# Patient Record
Sex: Female | Born: 1985 | Race: Asian | Hispanic: No | Marital: Married | Smoking: Never smoker
Health system: Southern US, Community
[De-identification: ages and names within clinical notes are randomized; demographics above are authoritative.]

## PROBLEM LIST (undated history)

## (undated) DIAGNOSIS — Z789 Other specified health status: Secondary | ICD-10-CM

## (undated) HISTORY — DX: Other specified health status: Z78.9

## (undated) HISTORY — PX: NO PAST SURGERIES: SHX2092

---

## 2011-01-21 ENCOUNTER — Other Ambulatory Visit: Payer: Self-pay

## 2011-01-21 DIAGNOSIS — Z331 Pregnant state, incidental: Secondary | ICD-10-CM

## 2011-01-21 NOTE — Progress Notes (Signed)
Prenatal labs done today [name] [name] 

## 2011-01-22 LAB — OBSTETRIC PANEL
Basophils Absolute: 0 10*3/uL (ref 0.0–0.1)
Basophils Relative: 0 % (ref 0–1)
Lymphocytes Relative: 26 % (ref 12–46)
MCHC: 33.5 g/dL (ref 30.0–36.0)
Neutro Abs: 7 10*3/uL (ref 1.7–7.7)
Platelets: 280 10*3/uL (ref 150–400)
RDW: 14.2 % (ref 11.5–15.5)
Rubella: 0.8 IU/mL
WBC: 10.9 10*3/uL — ABNORMAL HIGH (ref 4.0–10.5)

## 2011-01-22 LAB — HIV ANTIBODY (ROUTINE TESTING W REFLEX): HIV: NONREACTIVE

## 2011-01-23 LAB — CULTURE, OB URINE
Colony Count: NO GROWTH
Organism ID, Bacteria: NO GROWTH

## 2011-01-28 ENCOUNTER — Other Ambulatory Visit (HOSPITAL_COMMUNITY)
Admission: RE | Admit: 2011-01-28 | Discharge: 2011-01-28 | Disposition: A | Discharge status: Home / Self Care | Payer: Self-pay | Source: Ambulatory Visit | Attending: Emergency Medicine | Admitting: Emergency Medicine

## 2011-01-28 ENCOUNTER — Ambulatory Visit (INDEPENDENT_AMBULATORY_CARE_PROVIDER_SITE_OTHER): Payer: Self-pay | Admitting: Emergency Medicine

## 2011-01-28 ENCOUNTER — Encounter: Payer: Self-pay | Admitting: Emergency Medicine

## 2011-01-28 VITALS — BP 102/58 | Temp 98.7°F | Wt 109.6 lb

## 2011-01-28 DIAGNOSIS — Z01419 Encounter for gynecological examination (general) (routine) without abnormal findings: Secondary | ICD-10-CM | POA: Insufficient documentation

## 2011-01-28 DIAGNOSIS — Z23 Encounter for immunization: Secondary | ICD-10-CM

## 2011-01-28 DIAGNOSIS — Z3400 Encounter for supervision of normal first pregnancy, unspecified trimester: Secondary | ICD-10-CM

## 2011-01-28 DIAGNOSIS — Z111 Encounter for screening for respiratory tuberculosis: Secondary | ICD-10-CM

## 2011-01-28 MED ORDER — PRENATE ELITE 90-600-400 MG-MCG-MCG PO TABS: 1.0000 | ORAL_TABLET | Freq: Every day | ORAL | Status: AC

## 2011-01-28 NOTE — Patient Instructions (Signed)
Please follow up with me in 4-6 weeks for routine prenatal care.  You will have a PPD (or TB) test placed today.  You MUST return to the clinic on Thursday to have it read - this will be done with a nursing appointment.  Pregnancy If you are planning on getting pregnant, it is a good idea to make a preconception appointment with your care- giver to discuss having a healthy lifestyle before getting pregnant. Such as, diet, weight, exercise, taking prenatal vitamins especially folic acid (it helps prevent brain and spinal cord defects), avoiding alcohol, smoking and illegal drugs, medical problems (diabetes, convulsions), family history of genetic problems, working conditions and immunizations. It is better to have knowledge of these things and do something about them before getting pregnant. In your pregnancy, it is important to follow certain guidelines to have a healthy baby. It is very important to get good prenatal care and follow your caregiver's instructions. Prenatal care includes all the medical care you receive before your baby's birth. This helps to prevent problems during the pregnancy and childbirth. HOME CARE INSTRUCTIONS   Start your prenatal visits by the 12th week of pregnancy or before when possible. They are usually scheduled monthly at first. They are more often in the last 2 months before delivery. It is important that you keep your caregiver's appointments and follow your caregiver's instructions regarding medication use, exercise, and diet.   During pregnancy, you are providing food for you and your baby. Eat a regular, well-balanced diet. Choose foods such as meat, fish, milk and other dairy products, vegetables, fruits, whole-grain breads and cereals. Your caregiver will inform you of the ideal weight gain depending on your current height and weight. Drink lots of liquids. Try to drink 8 glasses of water a day.   Alcohol is associated with a number of birth defects including fetal  alcohol syndrome. It is best to avoid alcohol completely. Smoking will cause low birth rate and prematurity. Use of alcohol and nicotine during your pregnancy also increases the chances that your child will be chemically dependent later in their life and may contribute to SIDS (Sudden Infant Death Syndrome).   Do not use illegal drugs.   Only take prescription or over-the-counter medications that are recommended by your caregiver. Other medications can cause genetic and physical problems in the baby.   Morning sickness can often be helped by keeping soda crackers at the bedside. Eat a couple before arising in the morning.   A sexual relationship may be continued until near the end of pregnancy if there are no other problems such as early (premature) leaking of amniotic fluid from the membranes, vaginal bleeding, painful intercourse or belly (abdominal) pain.   Exercise regularly. Check with your caregiver if you are unsure of the safety of some of your exercises.   Do not use hot tubs, steam rooms or saunas. These increase the risk of fainting or passing out and hurting yourself and the baby. Swimming is OK for exercise. Get plenty of rest, including afternoon naps when possible especially in the third trimester.   Avoid toxic odors and chemicals.   Do not wear high heels. They may cause you to lose your balance and fall.   Do not lift over 5 pounds. If you do lift anything, lift with your legs and thighs, not your back.   Avoid long trips, especially in the third trimester.   If you have to travel out of the city or state, take a copy  of your medical records with you.  SEEK IMMEDIATE MEDICAL CARE IF:   You develop an unexplained oral temperature above 102 F (38.9 C), or as your caregiver suggests.   You have leaking of fluid from the vagina. If leaking membranes are suspected, take your temperature and inform your caregiver of this when you call.   There is vaginal spotting or  bleeding. Notify your caregiver of the amount and how many pads are used.   You continue to feel sick to your stomach (nauseous) and have no relief from remedies suggested, or you throw up (vomit) blood or coffee ground like materials.   You develop upper abdominal pain.   You have round ligament discomfort in the lower abdominal area. This still must be evaluated by your caregiver.   You feel contractions of the uterus.   You do not feel the baby move, or there is less movement than before.   You have painful urination.   You have abnormal vaginal discharge.   You have persistent diarrhea.   You get a severe headache.   You have problems with your vision.   You develop muscle weakness.   You feel dizzy and faint.   You develop shortness of breath.   You develop chest pain.   You have back pain that travels down to your leg and feet.   You feel irregular or a very fast heartbeat.   You develop excessive weight gain in a short period of time (5 pounds in 3 to 5 days).   You are involved with a domestic violence situation.  Document Released: 02/17/2005 Document Revised: 10/30/2010 Document Reviewed: 08/11/2008 [facility] Patient Information 2012 [place], [place].

## 2011-01-28 NOTE — Progress Notes (Signed)
S: No complaints of nausea or vomiting.  Does have occasional lower abdominal cramping that lasts 1-2 hours and resolves spontaneously.  No vaginal bleeding or discharge.    O: vitals as in flowsheet Pelvic: normal external genitalia; normal vagina and cervix; small amount of white discharge present; bimanual exam normal Ext: no peripheral edema  A/P: normal pregnancy -rubella non-immune -no history or varicella infx or vaccination -discussed avoiding exposure to people with rashes -planning on WIC, have orange card and are involved in another program for pregnancy assistance -Moved from [place] - no reported exposure to TB --> will place PPD; to return on Thursday for read -would like genetic screening - will do quad screen at 15-18 weeks. -flu shot today -prescription for prenatal vitamin given -discussed appropriate weight gain and diet -discussed taking only tylenol for pain during pregnancy

## 2011-01-29 LAB — GC/CHLAMYDIA PROBE AMP, GENITAL
Chlamydia, DNA Probe: NEGATIVE
GC Probe Amp, Genital: NEGATIVE

## 2011-01-30 ENCOUNTER — Ambulatory Visit (INDEPENDENT_AMBULATORY_CARE_PROVIDER_SITE_OTHER): Payer: Self-pay | Admitting: *Deleted

## 2011-01-30 DIAGNOSIS — R7611 Nonspecific reaction to tuberculin skin test without active tuberculosis: Secondary | ICD-10-CM

## 2011-01-30 LAB — TB SKIN TEST
Induration: 18
TB Skin Test: POSITIVE mm

## 2011-01-30 NOTE — Progress Notes (Signed)
In today  to read PPD that was placed 2 days ago. 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 Dr. Mauricio Po consulted and he came in and spoke with patient about this .  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  Denies any weight loss, fevers, cough. . Advised patient to address this after birth of baby. Will send message to Dr. Elwyn Reach also.

## 2011-02-10 NOTE — Progress Notes (Signed)
Patient seen in RN office; she has a positive ppd, administered at her New [facility] visit.  She denies fevers, chills, weight loss, cough, hemoptysis, or contact with anyone known to have active pulmonary TB.  She is counseled to follow up for positive PPD with the [facility] after delivery; no action needed during her pregnancy.  [name] [name]

## 2011-02-28 ENCOUNTER — Encounter: Payer: Self-pay | Admitting: Emergency Medicine

## 2011-03-04 NOTE — L&D Delivery Note (Signed)
Delivery Note At  a viable female was delivered via NSVD (Presentation: LOA ;  ).  APGAR: , ; weight .   Placenta status: spontaneous, intact.  Cord: 3-vessel with the following complications: none.  Cord pH: n/a  Anesthesia:  epidural Episiotomy: n/a Lacerations: none Est. Blood Loss (mL): 300  Mom to postpartum.  Baby to nursery-stable.  BOOTH, ERIN 08/17/2011, 5:03 AM   I was present for delivery and agree with note above. [facility]

## 2011-03-10 ENCOUNTER — Ambulatory Visit: Payer: Self-pay | Admitting: Emergency Medicine

## 2011-03-10 VITALS — BP 103/66 | Wt 116.0 lb

## 2011-03-10 DIAGNOSIS — Z3400 Encounter for supervision of normal first pregnancy, unspecified trimester: Secondary | ICD-10-CM

## 2011-03-10 NOTE — Progress Notes (Signed)
S: Doing well overall.  Does have some complaints of intermittent vomiting, none in the last week.  Also having some itching on her legs and belly, this is improved with lotion use; does not interfere with sleep.  Has also had some back pain, but does not require medication.  Has had several nosebleeds and her gums bleed after brushing her teeth.  The bleeding stops quickly.  Has felt the baby kick.  Denies vaginal bleeding or discharge.  O: vitals reviewed Fundal Height: just below umbilicus FHT: 140  A/P: normal pregnancy -discussed tylenol for back pain if needed -okay to use benadryl if itching is keeping her up at night -ordered anatomy screen -declined genetic screening -does not meet criteria for early glucola screening -continue prenatal vitamin -follow up in 4 weeks

## 2011-03-10 NOTE — Patient Instructions (Signed)
It was nice to see you again!  It sounds like things are going very well with your pregnancy.  The back pain, itchiness, vomiting and nose/gum bleeding are normal. You can take tylenol for the back pain if needed.  For the itching, continue to use lotion.  If the itching is preventing you from sleeping, it is okay to take Benadryl.  The vomiting/upset stomach should start to improve.  Try bland foods first, and eating smaller more frequent meals may help.  The nose bleeds and bleeding gums are normal.  If they start lasting a long time and are difficult to stop, we can check some lab values.  Pregnancy - Second Trimester The second trimester of pregnancy (3 to 6 months) is a period of rapid growth for you and your baby. At the end of the sixth month, your baby is about 9 inches long and weighs 1 1/2 pounds. You will begin to feel the baby move between 18 and 20 weeks of the pregnancy. This is called quickening. Weight gain is faster. A clear fluid (colostrum) may leak out of your breasts. You may feel small contractions of the womb (uterus). This is known as false labor or Braxton-Hicks contractions. This is like a practice for labor when the baby is ready to be born. Usually, the problems with morning sickness have usually passed by the end of your first trimester. Some women develop small dark blotches (called cholasma, mask of pregnancy) on their face that usually goes away after the baby is born. Exposure to the sun makes the blotches worse. Acne may also develop in some pregnant women and pregnant women who have acne, may find that it goes away. PRENATAL EXAMS  Blood work may continue to be done during prenatal exams. These tests are done to check on your health and the probable health of your baby. Blood work is used to follow your blood levels (hemoglobin). Anemia (low hemoglobin) is common during pregnancy. Iron and vitamins are given to help prevent this. You will also be checked for diabetes  between 24 and 28 weeks of the pregnancy. Some of the previous blood tests may be repeated.   The size of the uterus is measured during each visit. This is to make sure that the baby is continuing to grow properly according to the dates of the pregnancy.   Your blood pressure is checked every prenatal visit. This is to make sure you are not getting toxemia.   Your urine is checked to make sure you do not have an infection, diabetes or protein in the urine.   Your weight is checked often to make sure gains are happening at the suggested rate. This is to ensure that both you and your baby are growing normally.   Sometimes, an ultrasound is performed to confirm the proper growth and development of the baby. This is a test which bounces harmless sound waves off the baby so your caregiver can more accurately determine due dates.  Sometimes, a specialized test is done on the amniotic fluid surrounding the baby. This test is called an amniocentesis. The amniotic fluid is obtained by sticking a needle into the belly (abdomen). This is done to check the chromosomes in instances where there is a concern about possible genetic problems with the baby. It is also sometimes done near the end of pregnancy if an early delivery is required. In this case, it is done to help make sure the baby's lungs are mature enough for the baby  to live outside of the womb. CHANGES OCCURING IN THE SECOND TRIMESTER OF PREGNANCY Your body goes through many changes during pregnancy. They vary from person to person. Talk to your caregiver about changes you notice that you are concerned about.  During the second trimester, you will likely have an increase in your appetite. It is normal to have cravings for certain foods. This varies from person to person and pregnancy to pregnancy.   Your lower abdomen will begin to bulge.   You may have to urinate more often because the uterus and baby are pressing on your bladder. It is also common  to get more bladder infections during pregnancy (pain with urination). You can help this by drinking lots of fluids and emptying your bladder before and after intercourse.   You may begin to get stretch marks on your hips, abdomen, and breasts. These are normal changes in the body during pregnancy. There are no exercises or medications to take that prevent this change.   You may begin to develop swollen and bulging veins (varicose veins) in your legs. Wearing support hose, elevating your feet for 15 minutes, 3 to 4 times a day and limiting salt in your diet helps lessen the problem.   Heartburn may develop as the uterus grows and pushes up against the stomach. Antacids recommended by your caregiver helps with this problem. Also, eating smaller meals 4 to 5 times a day helps.   Constipation can be treated with a stool softener or adding bulk to your diet. Drinking lots of fluids, vegetables, fruits, and whole grains are helpful.   Exercising is also helpful. If you have been very active up until your pregnancy, most of these activities can be continued during your pregnancy. If you have been less active, it is helpful to start an exercise program such as walking.   Hemorrhoids (varicose veins in the rectum) may develop at the end of the second trimester. Warm sitz baths and hemorrhoid cream recommended by your caregiver helps hemorrhoid problems.   Backaches may develop during this time of your pregnancy. Avoid heavy lifting, wear low heal shoes and practice good posture to help with backache problems.   Some pregnant women develop tingling and numbness of their hand and fingers because of swelling and tightening of ligaments in the wrist (carpel tunnel syndrome). This goes away after the baby is born.   As your breasts enlarge, you may have to get a bigger bra. Get a comfortable, cotton, support bra. Do not get a nursing bra until the last month of the pregnancy if you will be nursing the baby.    You may get a dark line from your belly button to the pubic area called the linea nigra.   You may develop rosy cheeks because of increase blood flow to the face.   You may develop spider looking lines of the face, neck, arms and chest. These go away after the baby is born.  HOME CARE INSTRUCTIONS   It is extremely important to avoid all smoking, herbs, alcohol, and unprescribed drugs during your pregnancy. These chemicals affect the formation and growth of the baby. Avoid these chemicals throughout the pregnancy to ensure the delivery of a healthy infant.   Most of your home care instructions are the same as suggested for the first trimester of your pregnancy. Keep your caregiver's appointments. Follow your caregiver's instructions regarding medication use, exercise and diet.   During pregnancy, you are providing food for you and your baby.  Continue to eat regular, well-balanced meals. Choose foods such as meat, fish, milk and other low fat dairy products, vegetables, fruits, and whole-grain breads and cereals. Your caregiver will tell you of the ideal weight gain.   A physical sexual relationship may be continued up until near the end of pregnancy if there are no other problems. Problems could include early (premature) leaking of amniotic fluid from the membranes, vaginal bleeding, abdominal pain, or other medical or pregnancy problems.   Exercise regularly if there are no restrictions. Check with your caregiver if you are unsure of the safety of some of your exercises. The greatest weight gain will occur in the last 2 trimesters of pregnancy. Exercise will help you:   Control your weight.   Get you in shape for labor and delivery.   Lose weight after you have the baby.   Wear a good support or jogging bra for breast tenderness during pregnancy. This may help if worn during sleep. Pads or tissues may be used in the bra if you are leaking colostrum.   Do not use hot tubs, steam rooms or  saunas throughout the pregnancy.   Wear your seat belt at all times when driving. This protects you and your baby if you are in an accident.   Avoid raw meat, uncooked cheese, cat litter boxes and soil used by cats. These carry germs that can cause birth defects in the baby.   The second trimester is also a good time to visit your dentist for your dental health if this has not been done yet. Getting your teeth cleaned is OK. Use a soft toothbrush. Brush gently during pregnancy.   It is easier to loose urine during pregnancy. Tightening up and strengthening the pelvic muscles will help with this problem. Practice stopping your urination while you are going to the bathroom. These are the same muscles you need to strengthen. It is also the muscles you would use as if you were trying to stop from passing gas. You can practice tightening these muscles up 10 times a set and repeating this about 3 times per day. Once you know what muscles to tighten up, do not perform these exercises during urination. It is more likely to contribute to an infection by backing up the urine.   Ask for help if you have financial, counseling or nutritional needs during pregnancy. Your caregiver will be able to offer counseling for these needs as well as refer you for other special needs.   Your skin may become oily. If so, wash your face with mild soap, use non-greasy moisturizer and oil or cream based makeup.  MEDICATIONS AND DRUG USE IN PREGNANCY  Take prenatal vitamins as directed. The vitamin should contain 1 milligram of folic acid. Keep all vitamins out of reach of children. Only a couple vitamins or tablets containing iron may be fatal to a baby or young child when ingested.   Avoid use of all medications, including herbs, over-the-counter medications, not prescribed or suggested by your caregiver. Only take over-the-counter or prescription medicines for pain, discomfort, or fever as directed by your caregiver. Do not  use aspirin.   Let your caregiver also know about herbs you may be using.   Alcohol is related to a number of birth defects. This includes fetal alcohol syndrome. All alcohol, in any form, should be avoided completely. Smoking will cause low birth rate and premature babies.   Street or illegal drugs are very harmful to the baby. They are  absolutely forbidden. A baby born to an addicted mother will be addicted at birth. The baby will go through the same withdrawal an adult does.  SEEK MEDICAL CARE IF:  You have any concerns or worries during your pregnancy. It is better to call with your questions if you feel they cannot wait, rather than worry about them. SEEK IMMEDIATE MEDICAL CARE IF:   An unexplained oral temperature above 102 F (38.9 C) develops, or as your caregiver suggests.   You have leaking of fluid from the vagina (birth canal). If leaking membranes are suspected, take your temperature and tell your caregiver of this when you call.   There is vaginal spotting, bleeding, or passing clots. Tell your caregiver of the amount and how many pads are used. Light spotting in pregnancy is common, especially following intercourse.   You develop a bad smelling vaginal discharge with a change in the color from clear to white.   You continue to feel sick to your stomach (nauseated) and have no relief from remedies suggested. You vomit blood or coffee ground-like materials.   You lose more than 2 pounds of weight or gain more than 2 pounds of weight over 1 week, or as suggested by your caregiver.   You notice swelling of your face, hands, feet, or legs.   You get exposed to [place] measles and have never had them.   You are exposed to fifth disease or chickenpox.   You develop belly (abdominal) pain. Round ligament discomfort is a common non-cancerous (benign) cause of abdominal pain in pregnancy. Your caregiver still must evaluate you.   You develop a bad headache that does not go away.     You develop fever, diarrhea, pain with urination, or shortness of breath.   You develop visual problems, blurry, or double vision.   You fall or are in a car accident or any kind of trauma.   There is mental or physical violence at home.  Document Released: 02/11/2001 Document Revised: 10/30/2010 Document Reviewed: 08/16/2008 [facility] Patient Information 2012 [place], [place].

## 2011-03-19 ENCOUNTER — Ambulatory Visit (HOSPITAL_COMMUNITY)
Admission: RE | Admit: 2011-03-19 | Discharge: 2011-03-19 | Disposition: A | Discharge status: Home / Self Care | Payer: Self-pay | Source: Ambulatory Visit | Attending: Family Medicine | Admitting: Family Medicine

## 2011-03-19 DIAGNOSIS — O358XX0 Maternal care for other (suspected) fetal abnormality and damage, not applicable or unspecified: Secondary | ICD-10-CM | POA: Insufficient documentation

## 2011-03-19 DIAGNOSIS — Z1389 Encounter for screening for other disorder: Secondary | ICD-10-CM | POA: Insufficient documentation

## 2011-03-19 DIAGNOSIS — Z3400 Encounter for supervision of normal first pregnancy, unspecified trimester: Secondary | ICD-10-CM

## 2011-03-19 DIAGNOSIS — Z363 Encounter for antenatal screening for malformations: Secondary | ICD-10-CM | POA: Insufficient documentation

## 2011-04-03 ENCOUNTER — Ambulatory Visit: Payer: Self-pay | Admitting: Emergency Medicine

## 2011-04-03 MED ORDER — DEXTROMETHORPHAN HBR 15 MG/5ML PO SYRP: 10.0000 mL | ORAL_SOLUTION | Freq: Four times a day (QID) | ORAL | Status: AC | PRN

## 2011-04-03 NOTE — Patient Instructions (Addendum)
Everything is going very well!  I'm sorry you have a cough.  You can try cough drops or hard candy to help with the cough.  Another option is Vicks Vapor rub.  I will also provide a prescription for a cough medicine called dextromethorpan.  This a syrup that you can take up to 4 times a day.  I would recommend trying the Vicks Vapor rub and cough drops first.  If you get a fever with the cough, please take tylenol to bring your temperature down.  If you are interested in a internet resource about pregnancy, the website, [email or web address], is a good one.  I will see you back in 4 weeks!  Pregnancy - Second Trimester The second trimester of pregnancy (3 to 6 months) is a period of rapid growth for you and your baby. At the end of the sixth month, your baby is about 9 inches long and weighs 1 1/2 pounds. You will begin to feel the baby move between 18 and 20 weeks of the pregnancy. This is called quickening. Weight gain is faster. A clear fluid (colostrum) may leak out of your breasts. You may feel small contractions of the womb (uterus). This is known as false labor or Braxton-Hicks contractions. This is like a practice for labor when the baby is ready to be born. Usually, the problems with morning sickness have usually passed by the end of your first trimester. Some women develop small dark blotches (called cholasma, mask of pregnancy) on their face that usually goes away after the baby is born. Exposure to the sun makes the blotches worse. Acne may also develop in some pregnant women and pregnant women who have acne, may find that it goes away. PRENATAL EXAMS  Blood work may continue to be done during prenatal exams. These tests are done to check on your health and the probable health of your baby. Blood work is used to follow your blood levels (hemoglobin). Anemia (low hemoglobin) is common during pregnancy. Iron and vitamins are given to help prevent this. You will also be checked for diabetes between 24  and 28 weeks of the pregnancy. Some of the previous blood tests may be repeated.   The size of the uterus is measured during each visit. This is to make sure that the baby is continuing to grow properly according to the dates of the pregnancy.   Your blood pressure is checked every prenatal visit. This is to make sure you are not getting toxemia.   Your urine is checked to make sure you do not have an infection, diabetes or protein in the urine.   Your weight is checked often to make sure gains are happening at the suggested rate. This is to ensure that both you and your baby are growing normally.   Sometimes, an ultrasound is performed to confirm the proper growth and development of the baby. This is a test which bounces harmless sound waves off the baby so your caregiver can more accurately determine due dates.  Sometimes, a specialized test is done on the amniotic fluid surrounding the baby. This test is called an amniocentesis. The amniotic fluid is obtained by sticking a needle into the belly (abdomen). This is done to check the chromosomes in instances where there is a concern about possible genetic problems with the baby. It is also sometimes done near the end of pregnancy if an early delivery is required. In this case, it is done to help make sure the baby's lungs are  mature enough for the baby to live outside of the womb. CHANGES OCCURING IN THE SECOND TRIMESTER OF PREGNANCY Your body goes through many changes during pregnancy. They vary from person to person. Talk to your caregiver about changes you notice that you are concerned about.  During the second trimester, you will likely have an increase in your appetite. It is normal to have cravings for certain foods. This varies from person to person and pregnancy to pregnancy.   Your lower abdomen will begin to bulge.   You may have to urinate more often because the uterus and baby are pressing on your bladder. It is also common to get more  bladder infections during pregnancy (pain with urination). You can help this by drinking lots of fluids and emptying your bladder before and after intercourse.   You may begin to get stretch marks on your hips, abdomen, and breasts. These are normal changes in the body during pregnancy. There are no exercises or medications to take that prevent this change.   You may begin to develop swollen and bulging veins (varicose veins) in your legs. Wearing support hose, elevating your feet for 15 minutes, 3 to 4 times a day and limiting salt in your diet helps lessen the problem.   Heartburn may develop as the uterus grows and pushes up against the stomach. Antacids recommended by your caregiver helps with this problem. Also, eating smaller meals 4 to 5 times a day helps.   Constipation can be treated with a stool softener or adding bulk to your diet. Drinking lots of fluids, vegetables, fruits, and whole grains are helpful.   Exercising is also helpful. If you have been very active up until your pregnancy, most of these activities can be continued during your pregnancy. If you have been less active, it is helpful to start an exercise program such as walking.   Hemorrhoids (varicose veins in the rectum) may develop at the end of the second trimester. Warm sitz baths and hemorrhoid cream recommended by your caregiver helps hemorrhoid problems.   Backaches may develop during this time of your pregnancy. Avoid heavy lifting, wear low heal shoes and practice good posture to help with backache problems.   Some pregnant women develop tingling and numbness of their hand and fingers because of swelling and tightening of ligaments in the wrist (carpel tunnel syndrome). This goes away after the baby is born.   As your breasts enlarge, you may have to get a bigger bra. Get a comfortable, cotton, support bra. Do not get a nursing bra until the last month of the pregnancy if you will be nursing the baby.   You may get  a dark line from your belly button to the pubic area called the linea nigra.   You may develop rosy cheeks because of increase blood flow to the face.   You may develop spider looking lines of the face, neck, arms and chest. These go away after the baby is born.  HOME CARE INSTRUCTIONS   It is extremely important to avoid all smoking, herbs, alcohol, and unprescribed drugs during your pregnancy. These chemicals affect the formation and growth of the baby. Avoid these chemicals throughout the pregnancy to ensure the delivery of a healthy infant.   Most of your home care instructions are the same as suggested for the first trimester of your pregnancy. Keep your caregiver's appointments. Follow your caregiver's instructions regarding medication use, exercise and diet.   During pregnancy, you are providing food  for you and your baby. Continue to eat regular, well-balanced meals. Choose foods such as meat, fish, milk and other low fat dairy products, vegetables, fruits, and whole-grain breads and cereals. Your caregiver will tell you of the ideal weight gain.   A physical sexual relationship may be continued up until near the end of pregnancy if there are no other problems. Problems could include early (premature) leaking of amniotic fluid from the membranes, vaginal bleeding, abdominal pain, or other medical or pregnancy problems.   Exercise regularly if there are no restrictions. Check with your caregiver if you are unsure of the safety of some of your exercises. The greatest weight gain will occur in the last 2 trimesters of pregnancy. Exercise will help you:   Control your weight.   Get you in shape for labor and delivery.   Lose weight after you have the baby.   Wear a good support or jogging bra for breast tenderness during pregnancy. This may help if worn during sleep. Pads or tissues may be used in the bra if you are leaking colostrum.   Do not use hot tubs, steam rooms or saunas  throughout the pregnancy.   Wear your seat belt at all times when driving. This protects you and your baby if you are in an accident.   Avoid raw meat, uncooked cheese, cat litter boxes and soil used by cats. These carry germs that can cause birth defects in the baby.   The second trimester is also a good time to visit your dentist for your dental health if this has not been done yet. Getting your teeth cleaned is OK. Use a soft toothbrush. Brush gently during pregnancy.   It is easier to loose urine during pregnancy. Tightening up and strengthening the pelvic muscles will help with this problem. Practice stopping your urination while you are going to the bathroom. These are the same muscles you need to strengthen. It is also the muscles you would use as if you were trying to stop from passing gas. You can practice tightening these muscles up 10 times a set and repeating this about 3 times per day. Once you know what muscles to tighten up, do not perform these exercises during urination. It is more likely to contribute to an infection by backing up the urine.   Ask for help if you have financial, counseling or nutritional needs during pregnancy. Your caregiver will be able to offer counseling for these needs as well as refer you for other special needs.   Your skin may become oily. If so, wash your face with mild soap, use non-greasy moisturizer and oil or cream based makeup.  MEDICATIONS AND DRUG USE IN PREGNANCY  Take prenatal vitamins as directed. The vitamin should contain 1 milligram of folic acid. Keep all vitamins out of reach of children. Only a couple vitamins or tablets containing iron may be fatal to a baby or young child when ingested.   Avoid use of all medications, including herbs, over-the-counter medications, not prescribed or suggested by your caregiver. Only take over-the-counter or prescription medicines for pain, discomfort, or fever as directed by your caregiver. Do not use  aspirin.   Let your caregiver also know about herbs you may be using.   Alcohol is related to a number of birth defects. This includes fetal alcohol syndrome. All alcohol, in any form, should be avoided completely. Smoking will cause low birth rate and premature babies.   Street or illegal drugs are very harmful  to the baby. They are absolutely forbidden. A baby born to an addicted mother will be addicted at birth. The baby will go through the same withdrawal an adult does.  SEEK MEDICAL CARE IF:  You have any concerns or worries during your pregnancy. It is better to call with your questions if you feel they cannot wait, rather than worry about them. SEEK IMMEDIATE MEDICAL CARE IF:   An unexplained oral temperature above 102 F (38.9 C) develops, or as your caregiver suggests.   You have leaking of fluid from the vagina (birth canal). If leaking membranes are suspected, take your temperature and tell your caregiver of this when you call.   There is vaginal spotting, bleeding, or passing clots. Tell your caregiver of the amount and how many pads are used. Light spotting in pregnancy is common, especially following intercourse.   You develop a bad smelling vaginal discharge with a change in the color from clear to white.   You continue to feel sick to your stomach (nauseated) and have no relief from remedies suggested. You vomit blood or coffee ground-like materials.   You lose more than 2 pounds of weight or gain more than 2 pounds of weight over 1 week, or as suggested by your caregiver.   You notice swelling of your face, hands, feet, or legs.   You get exposed to [place] measles and have never had them.   You are exposed to fifth disease or chickenpox.   You develop belly (abdominal) pain. Round ligament discomfort is a common non-cancerous (benign) cause of abdominal pain in pregnancy. Your caregiver still must evaluate you.   You develop a bad headache that does not go away.    You develop fever, diarrhea, pain with urination, or shortness of breath.   You develop visual problems, blurry, or double vision.   You fall or are in a car accident or any kind of trauma.   There is mental or physical violence at home.  Document Released: 02/11/2001 Document Revised: 10/30/2010 Document Reviewed: 08/16/2008 [facility] Patient Information 2012 [place], [place].

## 2011-04-03 NOTE — Progress Notes (Signed)
S: Doing very well.  No more vomiting.  Itching is improved.  Baby is moving well; ultrasound showed they are having a boy.  Denies vaginal bleeding or discharge.  Has had runny nose and cough x4-5 days.  No fevers.  Would like something for the cough.  O: vitals reviewed Fundal Height: at umbilicus FHT: 144  A/P: 26 yo G1P0 at [redacted]w[redacted]d -anatomy scan normal -continue prenatal vitamin -encouraged vicks/cough drops for cough; gave prescription for dextromethorpan if needed -follow up in 4 weeks

## 2011-04-03 NOTE — Progress Notes (Signed)
Also discussed weight gain of about 1 lb per week.

## 2011-05-02 ENCOUNTER — Ambulatory Visit: Payer: Self-pay | Admitting: Emergency Medicine

## 2011-05-02 VITALS — BP 110/60 | Wt 128.0 lb

## 2011-05-02 DIAGNOSIS — Z3400 Encounter for supervision of normal first pregnancy, unspecified trimester: Secondary | ICD-10-CM

## 2011-05-02 NOTE — Progress Notes (Signed)
S: Continues to have a cough, but this is starting to improve.  Have been using Vic's Vapor Rub and cough drops.  Has had two episodes of brief, sharp lower abdominal pain that self-resolved.  No vaginal bleeding or discharge.  No dizziness or RUQ pain.  O: vitals reviewed Fundal height - 24cm FHT - 150 Ext - no edema  A/P: 26 yo G1P0 at [redacted]w[redacted]d -weight gain appropriate -fetal growth appropriate -continue prenatal vitamin -continue symptomatic care for cough -can use tylenol for round ligament pain -will return to clinic in 1 week for 1-hr glucola screening -return in 4 weeks for follow up in OB clinic -discussed using common sense to limit foods/activity -hand out provided on 2nd trimester -will go over preterm labor precautions at next visit

## 2011-05-02 NOTE — Patient Instructions (Signed)
I'm sorry you still have a cough.  Keep doing the Vic's Vapor Rub and cough drops.  I would expect the cough to be getting better over the next 2 weeks.  You can always try the dextromethorpan if needed, but the cough will not hurt the baby.  For the occasional belly pain, you can take Tylenol.  It is a very safe medicine during pregnancy.  The baby looks great today!  He is growing appropriately and his heart beat is excellent!  Please make a lab appointment for next week to get the diabetes screening test - it will take about an hour.  Please make a follow up appointment in [facility] in [facility] (they like to see everyone during the second and third trimesters).   Pregnancy - Second Trimester The second trimester of pregnancy (3 to 6 months) is a period of rapid growth for you and your baby. At the end of the sixth month, your baby is about 9 inches long and weighs 1 1/2 pounds. You will begin to feel the baby move between 18 and 20 weeks of the pregnancy. This is called quickening. Weight gain is faster. A clear fluid (colostrum) may leak out of your breasts. You may feel small contractions of the womb (uterus). This is known as false labor or Braxton-Hicks contractions. This is like a practice for labor when the baby is ready to be born. Usually, the problems with morning sickness have usually passed by the end of your first trimester. Some women develop small dark blotches (called cholasma, mask of pregnancy) on their face that usually goes away after the baby is born. Exposure to the sun makes the blotches worse. Acne may also develop in some pregnant women and pregnant women who have acne, may find that it goes away. PRENATAL EXAMS  Blood work may continue to be done during prenatal exams. These tests are done to check on your health and the probable health of your baby. Blood work is used to follow your blood levels (hemoglobin). Anemia (low hemoglobin) is common during pregnancy.  Iron and vitamins are given to help prevent this. You will also be checked for diabetes between 24 and 28 weeks of the pregnancy. Some of the previous blood tests may be repeated.   The size of the uterus is measured during each visit. This is to make sure that the baby is continuing to grow properly according to the dates of the pregnancy.   Your blood pressure is checked every prenatal visit. This is to make sure you are not getting toxemia.   Your urine is checked to make sure you do not have an infection, diabetes or protein in the urine.   Your weight is checked often to make sure gains are happening at the suggested rate. This is to ensure that both you and your baby are growing normally.   Sometimes, an ultrasound is performed to confirm the proper growth and development of the baby. This is a test which bounces harmless sound waves off the baby so your caregiver can more accurately determine due dates.  Sometimes, a specialized test is done on the amniotic fluid surrounding the baby. This test is called an amniocentesis. The amniotic fluid is obtained by sticking a needle into the belly (abdomen). This is done to check the chromosomes in instances where there is a concern about possible genetic problems with the baby. It is also sometimes done near the end of pregnancy if an early  delivery is required. In this case, it is done to help make sure the baby's lungs are mature enough for the baby to live outside of the womb. CHANGES OCCURING IN THE SECOND TRIMESTER OF PREGNANCY Your body goes through many changes during pregnancy. They vary from person to person. Talk to your caregiver about changes you notice that you are concerned about.  During the second trimester, you will likely have an increase in your appetite. It is normal to have cravings for certain foods. This varies from person to person and pregnancy to pregnancy.   Your lower abdomen will begin to bulge.   You may have to urinate  more often because the uterus and baby are pressing on your bladder. It is also common to get more bladder infections during pregnancy (pain with urination). You can help this by drinking lots of fluids and emptying your bladder before and after intercourse.   You may begin to get stretch marks on your hips, abdomen, and breasts. These are normal changes in the body during pregnancy. There are no exercises or medications to take that prevent this change.   You may begin to develop swollen and bulging veins (varicose veins) in your legs. Wearing support hose, elevating your feet for 15 minutes, 3 to 4 times a day and limiting salt in your diet helps lessen the problem.   Heartburn may develop as the uterus grows and pushes up against the stomach. Antacids recommended by your caregiver helps with this problem. Also, eating smaller meals 4 to 5 times a day helps.   Constipation can be treated with a stool softener or adding bulk to your diet. Drinking lots of fluids, vegetables, fruits, and whole grains are helpful.   Exercising is also helpful. If you have been very active up until your pregnancy, most of these activities can be continued during your pregnancy. If you have been less active, it is helpful to start an exercise program such as walking.   Hemorrhoids (varicose veins in the rectum) may develop at the end of the second trimester. Warm sitz baths and hemorrhoid cream recommended by your caregiver helps hemorrhoid problems.   Backaches may develop during this time of your pregnancy. Avoid heavy lifting, wear low heal shoes and practice good posture to help with backache problems.   Some pregnant women develop tingling and numbness of their hand and fingers because of swelling and tightening of ligaments in the wrist (carpel tunnel syndrome). This goes away after the baby is born.   As your breasts enlarge, you may have to get a bigger bra. Get a comfortable, cotton, support bra. Do not get a  nursing bra until the last month of the pregnancy if you will be nursing the baby.   You may get a dark line from your belly button to the pubic area called the linea nigra.   You may develop rosy cheeks because of increase blood flow to the face.   You may develop spider looking lines of the face, neck, arms and chest. These go away after the baby is born.  HOME CARE INSTRUCTIONS   It is extremely important to avoid all smoking, herbs, alcohol, and unprescribed drugs during your pregnancy. These chemicals affect the formation and growth of the baby. Avoid these chemicals throughout the pregnancy to ensure the delivery of a healthy infant.   Most of your home care instructions are the same as suggested for the first trimester of your pregnancy. Keep your caregiver's appointments. Follow  your caregiver's instructions regarding medication use, exercise and diet.   During pregnancy, you are providing food for you and your baby. Continue to eat regular, well-balanced meals. Choose foods such as meat, fish, milk and other low fat dairy products, vegetables, fruits, and whole-grain breads and cereals. Your caregiver will tell you of the ideal weight gain.   A physical sexual relationship may be continued up until near the end of pregnancy if there are no other problems. Problems could include early (premature) leaking of amniotic fluid from the membranes, vaginal bleeding, abdominal pain, or other medical or pregnancy problems.   Exercise regularly if there are no restrictions. Check with your caregiver if you are unsure of the safety of some of your exercises. The greatest weight gain will occur in the last 2 trimesters of pregnancy. Exercise will help you:   Control your weight.   Get you in shape for labor and delivery.   Lose weight after you have the baby.   Wear a good support or jogging bra for breast tenderness during pregnancy. This may help if worn during sleep. Pads or tissues may be  used in the bra if you are leaking colostrum.   Do not use hot tubs, steam rooms or saunas throughout the pregnancy.   Wear your seat belt at all times when driving. This protects you and your baby if you are in an accident.   Avoid raw meat, uncooked cheese, cat litter boxes and soil used by cats. These carry germs that can cause birth defects in the baby.   The second trimester is also a good time to visit your dentist for your dental health if this has not been done yet. Getting your teeth cleaned is OK. Use a soft toothbrush. Brush gently during pregnancy.   It is easier to loose urine during pregnancy. Tightening up and strengthening the pelvic muscles will help with this problem. Practice stopping your urination while you are going to the bathroom. These are the same muscles you need to strengthen. It is also the muscles you would use as if you were trying to stop from passing gas. You can practice tightening these muscles up 10 times a set and repeating this about 3 times per day. Once you know what muscles to tighten up, do not perform these exercises during urination. It is more likely to contribute to an infection by backing up the urine.   Ask for help if you have financial, counseling or nutritional needs during pregnancy. Your caregiver will be able to offer counseling for these needs as well as refer you for other special needs.   Your skin may become oily. If so, wash your face with mild soap, use non-greasy moisturizer and oil or cream based makeup.  MEDICATIONS AND DRUG USE IN PREGNANCY  Take prenatal vitamins as directed. The vitamin should contain 1 milligram of folic acid. Keep all vitamins out of reach of children. Only a couple vitamins or tablets containing iron may be fatal to a baby or young child when ingested.   Avoid use of all medications, including herbs, over-the-counter medications, not prescribed or suggested by your caregiver. Only take over-the-counter or  prescription medicines for pain, discomfort, or fever as directed by your caregiver. Do not use aspirin.   Let your caregiver also know about herbs you may be using.   Alcohol is related to a number of birth defects. This includes fetal alcohol syndrome. All alcohol, in any form, should be avoided completely. Smoking  will cause low birth rate and premature babies.   Street or illegal drugs are very harmful to the baby. They are absolutely forbidden. A baby born to an addicted mother will be addicted at birth. The baby will go through the same withdrawal an adult does.  SEEK MEDICAL CARE IF:  You have any concerns or worries during your pregnancy. It is better to call with your questions if you feel they cannot wait, rather than worry about them. SEEK IMMEDIATE MEDICAL CARE IF:   An unexplained oral temperature above 102 F (38.9 C) develops, or as your caregiver suggests.   You have leaking of fluid from the vagina (birth canal). If leaking membranes are suspected, take your temperature and tell your caregiver of this when you call.   There is vaginal spotting, bleeding, or passing clots. Tell your caregiver of the amount and how many pads are used. Light spotting in pregnancy is common, especially following intercourse.   You develop a bad smelling vaginal discharge with a change in the color from clear to white.   You continue to feel sick to your stomach (nauseated) and have no relief from remedies suggested. You vomit blood or coffee ground-like materials.   You lose more than 2 pounds of weight or gain more than 2 pounds of weight over 1 week, or as suggested by your caregiver.   You notice swelling of your face, hands, feet, or legs.   You get exposed to [place] measles and have never had them.   You are exposed to fifth disease or chickenpox.   You develop belly (abdominal) pain. Round ligament discomfort is a common non-cancerous (benign) cause of abdominal pain in pregnancy.  Your caregiver still must evaluate you.   You develop a bad headache that does not go away.   You develop fever, diarrhea, pain with urination, or shortness of breath.   You develop visual problems, blurry, or double vision.   You fall or are in a car accident or any kind of trauma.   There is mental or physical violence at home.  Document Released: 02/11/2001 Document Revised: 10/30/2010 Document Reviewed: 08/16/2008 [facility] Patient Information 2012 [place], [place].

## 2011-05-08 ENCOUNTER — Other Ambulatory Visit: Payer: Self-pay

## 2011-05-08 DIAGNOSIS — Z3400 Encounter for supervision of normal first pregnancy, unspecified trimester: Secondary | ICD-10-CM

## 2011-05-08 LAB — GLUCOSE, CAPILLARY: Comment 1: 14

## 2011-05-08 NOTE — Progress Notes (Signed)
1 HR OB glucose = 149 mg/dl 3 hr GTT scheduled  [id number] BAJORDAN, MLS

## 2011-05-08 NOTE — Progress Notes (Signed)
1 hr gtt done today [name] [name] 

## 2011-05-09 ENCOUNTER — Telehealth: Payer: Self-pay | Admitting: Emergency Medicine

## 2011-05-09 NOTE — Telephone Encounter (Signed)
Husband reports that on 03/04  patient experienced what he describes as severe abdominal pain that lasted for 20 minutes. Next day  she had same discomfort that lasted 10 minutes. No pain since that time. They did not contact doctor at that time. States her English is limited ,so cannot talk with patient. . Consulted with Dr. Earnest Bailey and she advised if has any further pain to go to [facility] . If no further pain  come in here for check on Monday 03/11.  Appointment scheduled at 8:30 with [place].

## 2011-05-09 NOTE — Telephone Encounter (Signed)
Want to ask about the pain wife had on Tuesday to her abd area.  [redacted] wks pregnant.  Pain came and went as suddenly as it started.  Thought this was unusual.  Need to speak with someone regarding this.

## 2011-05-12 ENCOUNTER — Ambulatory Visit (INDEPENDENT_AMBULATORY_CARE_PROVIDER_SITE_OTHER): Payer: Self-pay | Admitting: Family Medicine

## 2011-05-12 DIAGNOSIS — Z3400 Encounter for supervision of normal first pregnancy, unspecified trimester: Secondary | ICD-10-CM

## 2011-05-12 DIAGNOSIS — N390 Urinary tract infection, site not specified: Secondary | ICD-10-CM | POA: Insufficient documentation

## 2011-05-12 DIAGNOSIS — R109 Unspecified abdominal pain: Secondary | ICD-10-CM

## 2011-05-12 LAB — POCT URINALYSIS DIPSTICK
Bilirubin, UA: NEGATIVE
Glucose, UA: NEGATIVE
Ketones, UA: NEGATIVE
Nitrite, UA: POSITIVE
pH, UA: 6.5

## 2011-05-12 LAB — POCT UA - MICROSCOPIC ONLY: WBC, Ur, HPF, POC: >20

## 2011-05-12 MED ORDER — CEPHALEXIN 500 MG PO CAPS: 500.0000 mg | ORAL_CAPSULE | Freq: Two times a day (BID) | ORAL | Status: AC

## 2011-05-12 NOTE — Patient Instructions (Signed)
It was great to see you today!  Keep appointment to see Dr. Elwyn Reach as scheduled in April.  These are most likely [facility] Contractions.

## 2011-05-12 NOTE — Progress Notes (Signed)
Patient had leuks/nitrites positive on her U/A.  Will give 5 days of keflex 500 mg BID. Advised to call for a diflucan script if she develops a yeast infection.

## 2011-05-12 NOTE — Progress Notes (Signed)
Addended by: Edd Arbour on: 05/12/2011 10:00 AM   Modules accepted: Orders

## 2011-05-12 NOTE — Progress Notes (Signed)
S: c/o 20 minute long episodes of abdominal pressure that is very painful. No vaginal bleeding/discharge. Pain completely resolves. Associated with episodic constipation and loose stools. No n/v/d. No fever. Baby is moving well. No water break.  Denies dysuria, bleeding, sexual pain.   Filed Vitals:   05/12/11 0853  BP: 86/66  Temp: 97.8 F (36.6 C)  Weight: 127 lb (57.607 kg)  PE: Abdomen: Gravid, soft and non-tender without masses, organomegaly or hernias noted.  No guarding or rebound. CVAT positive Lungs:  Normal respiratory effort, chest expands symmetrically. Lungs are clear to auscultation, no crackles or wheezes. Heart - Regular rate and rhythm.  No murmurs, gallops or rubs.    Extremities:  No cyanosis, edema, or deformity noted   A/P: 26 week preg. G1 with likely Braxton Hicks contractions vs. Gas pain. 1. Keep follow up with Dr. Elwyn Reach. 2. Advised to eat well and stay hydrated. 3. Risk factors for preterm labor reviewed. 4. Advised miralax for constipation. 5. Discussed braxton hicks contractions. 6. Ordered U/A because of bilateral CVAT - I think this is preg. Weight related back pain however.

## 2011-05-12 NOTE — Progress Notes (Signed)
Addended by: [place], Klohe Lovering on: 05/12/2011 10:08 AM   Modules accepted: Orders

## 2011-05-15 ENCOUNTER — Other Ambulatory Visit: Payer: Self-pay

## 2011-05-15 DIAGNOSIS — Z3400 Encounter for supervision of normal first pregnancy, unspecified trimester: Secondary | ICD-10-CM

## 2011-05-15 LAB — CULTURE, OB URINE: Colony Count: >=100000

## 2011-05-15 NOTE — Progress Notes (Signed)
3 hr gtt done today [name] [name] 

## 2011-05-16 LAB — GLUCOSE TOLERANCE, 3 HOURS
Glucose Tolerance, 1 hour: 113 mg/dL (ref 70–189)
Glucose Tolerance, 2 hour: 99 mg/dL (ref 70–164)
Glucose, GTT - 3 Hour: 72 mg/dL (ref 70–144)

## 2011-06-04 ENCOUNTER — Encounter: Payer: Self-pay | Admitting: Family Medicine

## 2011-06-05 ENCOUNTER — Ambulatory Visit (INDEPENDENT_AMBULATORY_CARE_PROVIDER_SITE_OTHER): Payer: Self-pay | Admitting: Family Medicine

## 2011-06-05 VITALS — BP 88/58 | Temp 98.3°F | Wt 130.0 lb

## 2011-06-05 DIAGNOSIS — Z3400 Encounter for supervision of normal first pregnancy, unspecified trimester: Secondary | ICD-10-CM

## 2011-06-05 DIAGNOSIS — Z23 Encounter for immunization: Secondary | ICD-10-CM

## 2011-06-05 NOTE — Progress Notes (Signed)
Addended by: Deno Etienne on: 06/05/2011 04:36 PM   Modules accepted: Orders

## 2011-06-05 NOTE — Progress Notes (Signed)
Pt here for return OB visit at 29 and 3/7.   No complaints. Feeling well overall. No vaginal bleeding, no discharge, no contractions, no pain. Good fetal movement. She and her husband are concerned about the delivery. They had a friend who lost a baby one month ago after having been discharged from the MAU. They express worry about having experienced staff. Has a history of positive PPD during pregnancy. No cough, no weight loss, no night sweats.  O: vitals: see above flowsheet A/P: 26 yo G1P0 at 29.3wga who presented at [facility] clinic for routine follow up. - Dr. [place] reassured patient and her husband that Dr. Elwyn Reach is experienced with deliveries. Explained the structure of the resident, midwife and attending team at [facility]. Patient and husband seemed reassured. - obtain CBC, RPR, HIV today - tDaP today - urine culture for test of cure. Has finished course of antibiotics.  - positive PPD: patient with no evidence of active TB. Will refer to health department postpartum.  - follow up in 2 weeks - reviewed kick counts - gave information about classes at [facility].

## 2011-06-05 NOTE — Patient Instructions (Addendum)
It was great meeting you today! As we talked about, if you feel like the baby is not moving as well, you can count in the first hour. If he kicks 5 times in an hour, everything is good. If he kicks less than 5 times, you can have something to drink, walk around and then count for another hour. You want the baby to move at least 10 times in 2 hrs. If baby doesn't move that many times, you can come to [facility] at the Maternal Admissions Unit (MAU).  And of course, feel free to call the clinic at any time if you have questions or concerns.   To get more information about a doula, the person who can provide extra support during pregnancy, you can call the following number:[phone]   Fetal Movement Counts Kick counts is highly recommended in high risk pregnancies, but it is a good idea for every pregnant woman to do. Start counting fetal movements at 28 weeks of the pregnancy. Fetal movements increase after eating a full meal or eating or drinking something sweet (the blood sugar is higher). It is also important to drink plenty of fluids (well hydrated) before doing the count. Lie on your left side because it helps with the circulation or you can sit in a comfortable chair with your arms over your belly (abdomen) with no distractions around you. DOING THE COUNT  Try to do the count the same time of day each time you do it.   Mark the day and time, then see how long it takes for you to feel 10 movements (kicks, flutters, swishes, rolls). You should have at least 10 movements within 2 hours. You will most likely feel 10 movements in much less than 2 hours. If you do not, wait an hour and count again. After a couple of days you will see a pattern.   What you are looking for is a change in the pattern or not enough counts in 2 hours. Is it taking longer in time to reach 10 movements?  SEEK MEDICAL CARE IF:  You feel less than 10 counts in 2 hours. Tried twice.   No movement in one hour.   The  pattern is changing or taking longer each day to reach 10 counts in 2 hours.   You feel the baby is not moving as it usually does.  Document Released: 03/19/2006 Document Revised: 02/06/2011 Document Reviewed: 09/19/2008 [facility] Patient Information 2012 [place], [place].

## 2011-06-06 LAB — CBC WITH DIFFERENTIAL/PLATELET
Basophils Absolute: 0 10*3/uL (ref 0.0–0.1)
Basophils Relative: 0 % (ref 0–1)
Lymphocytes Relative: 20 % (ref 12–46)
MCHC: 32.9 g/dL (ref 30.0–36.0)
Neutro Abs: 8.7 10*3/uL — ABNORMAL HIGH (ref 1.7–7.7)
Platelets: 260 10*3/uL (ref 150–400)
RDW: 13.8 % (ref 11.5–15.5)
WBC: 12.1 10*3/uL — ABNORMAL HIGH (ref 4.0–10.5)

## 2011-06-06 LAB — HIV ANTIBODY (ROUTINE TESTING W REFLEX): HIV: NONREACTIVE

## 2011-06-06 LAB — RPR

## 2011-06-09 ENCOUNTER — Other Ambulatory Visit: Payer: Self-pay | Admitting: Family Medicine

## 2011-06-09 DIAGNOSIS — O09899 Supervision of other high risk pregnancies, unspecified trimester: Secondary | ICD-10-CM

## 2011-06-09 LAB — CULTURE, OB URINE: Colony Count: >=100000

## 2011-06-09 MED ORDER — NITROFURANTOIN MONOHYD MACRO 100 MG PO CAPS: 100.0000 mg | ORAL_CAPSULE | Freq: Two times a day (BID) | ORAL | Status: AC

## 2011-06-09 NOTE — Progress Notes (Signed)
Spoke with pt.  Informed her of the need for another course of antibiotics for her urine.  She will have her husband pick up the medicine today or tomorrow. Will fax rx for nitrofurantoin now.  Pt will need TOC after treatment.

## 2011-06-18 ENCOUNTER — Ambulatory Visit: Payer: Self-pay | Admitting: Emergency Medicine

## 2011-06-18 VITALS — BP 106/71 | Wt 134.0 lb

## 2011-06-18 DIAGNOSIS — Z3400 Encounter for supervision of normal first pregnancy, unspecified trimester: Secondary | ICD-10-CM

## 2011-06-18 NOTE — Progress Notes (Signed)
S: 25yo F at [redacted]w[redacted]d coming in for routine OB care.  Recently treated with Macrobid for an E coli UTI.  Feeling good today.  Good fetal movement.  No contractions, vaginal bleeding, LOF, pain.  Have concerns about delivery.  O: see flowsheet  A/P: 26 yo G1 at [redacted]w[redacted]d - will get urine for test of cure - continue prenatal - answered questions about delivery - went over kick counts - reviewed labor precautions - follow up in 2 weeks

## 2011-06-18 NOTE — Patient Instructions (Signed)
It was nice to see you again!  Everything is going very well.  Your baby has a strong heartbeat today.  Please go to [facility] if you stop feeling the baby move, you have a big gush of fluid, or any vaginal bleeding.  I will see you in 2 weeks.

## 2011-06-20 LAB — CULTURE, OB URINE: Colony Count: 8000

## 2011-07-03 ENCOUNTER — Ambulatory Visit: Payer: Self-pay | Admitting: Emergency Medicine

## 2011-07-03 NOTE — Patient Instructions (Signed)
It was nice to see you!  Everything is going very well.  The discharge you are having is normal.  If you start having regular contractions, vaginal bleeding, big gush of fluid, or stop feeling the baby move, please go to [facility] for evaluation.   I will see you back in 2 weeks.

## 2011-07-03 NOTE — Progress Notes (Signed)
S: 25yo G1 at [redacted]w[redacted]d.  Test of cure urine negative.  Doing well today.  Does have some lower abdominal pain that typically occurs in the evenings and last about 30-60 minutes before resolving spontaneously.  No contractions, vaginal bleeding, LOF.  Does have increased amount of thin white discharge, no itching or burning.  Also has some dry skin on her hands and increased sweating.  O: see flowsheet  A/P - continue prenatal  - reviewed kick counts and labor precautions - provided reassurance than some lower abdominal pain is normal - can use support belt if needed - follow up in 2 weeks

## 2011-07-18 ENCOUNTER — Ambulatory Visit: Payer: Self-pay | Admitting: Emergency Medicine

## 2011-07-18 VITALS — BP 106/72 | Temp 98.4°F | Wt 137.5 lb

## 2011-07-18 DIAGNOSIS — Z3400 Encounter for supervision of normal first pregnancy, unspecified trimester: Secondary | ICD-10-CM

## 2011-07-18 DIAGNOSIS — Z3490 Encounter for supervision of normal pregnancy, unspecified, unspecified trimester: Secondary | ICD-10-CM

## 2011-07-18 DIAGNOSIS — O09813 Supervision of pregnancy resulting from assisted reproductive technology, third trimester: Secondary | ICD-10-CM

## 2011-07-18 NOTE — Progress Notes (Signed)
S: 26 yo G1 at [redacted]w[redacted]d.  No acute concerns.  Lots of questions about delivery.  + fetal movement. No vaginal bleeding, discharge, or contractions.   Pediatrician: Dr. Elwyn Reach  Birth control: nexplanon? Feeding: Breast Circ: yes  O: see flowsheet Pelvic: thin white discharge present; cervix friable appearing  A/P: 26 yo G1 at [redacted]w[redacted]d - GBS and cervical cx collected today - answered questions about labor and delivery - continue prenatal vitamin - kick counts and labor precautions reviewed - follow up in 2 weeks

## 2011-07-18 NOTE — Patient Instructions (Addendum)
If you feel like the baby is not moving as much as usual, please do "kick counts." To feel the baby, sit in a quiet place with your feet up, free of distractions. If you feel the baby kick 5 times in 1 hour, you can stop. If not, feel for a second hour. 10 kicks in 2 hours is very reassuring and makes [place] feel that the baby is doing well.  If you would like, you can dry drinking a small amount of caffeine before counting for the 2nd hour to help baby wake up.  If you do not feel 10 kicks in 2 hours while concentrating, please go to the MAU.   If you have regular contractions that are 3-5 minutes apart for at least one hour, bleeding or fluid from your vagina, or you do not feel the baby moving enough, please go to the [facility].  Follow up with me in 2 weeks.

## 2011-07-21 ENCOUNTER — Encounter: Payer: Self-pay | Admitting: Emergency Medicine

## 2011-08-01 ENCOUNTER — Ambulatory Visit: Payer: Self-pay | Admitting: Emergency Medicine

## 2011-08-01 NOTE — Patient Instructions (Signed)
It was nice to see you again!  Baby could arrive any day now!  If you feel like the baby is not moving as much as usual, please do "kick counts." To feel the baby, sit in a quiet place with your feet up, free of distractions. If you feel the baby kick 5 times in 1 hour, you can stop. If not, feel for a second hour. 10 kicks in 2 hours is very reassuring and makes [place] feel that the baby is doing well.  If you would like, you can dry drinking a small amount of caffeine before counting for the 2nd hour to help baby wake up.  If you do not feel 10 kicks in 2 hours while concentrating, please go to the MAU.   If you have regular contractions that are 3-5 minutes apart for at least one hour, bleeding or fluid from your vagina, or you do not feel the baby moving enough, please go to the [facility].  I will see you back in 1 week.

## 2011-08-01 NOTE — Progress Notes (Signed)
S: 26 year old G1 at [redacted]w[redacted]d.  Did have episode of abdominal tightness/pain Wednesday night.  Lasted about 30 minutes and resolved spontaneously.  No fevers, n/v/d.  Able to eat normally since episode.  No further pains.  + fetal movement.  No vaginal discharge, bleeding, LOF, contractions.  O: see flowsheet  A/P: 26 yo G1 at [redacted]w[redacted]d - continue prenatal vitamin - GBS negative - discussed options for pain control during labor - kick count, labor precautions reviewed - follow up in 1 week

## 2011-08-08 ENCOUNTER — Ambulatory Visit (INDEPENDENT_AMBULATORY_CARE_PROVIDER_SITE_OTHER): Payer: Self-pay | Admitting: Family Medicine

## 2011-08-08 ENCOUNTER — Telehealth: Payer: Self-pay | Admitting: *Deleted

## 2011-08-08 VITALS — BP 104/71 | Wt 140.0 lb

## 2011-08-08 DIAGNOSIS — Z3400 Encounter for supervision of normal first pregnancy, unspecified trimester: Secondary | ICD-10-CM

## 2011-08-08 DIAGNOSIS — W19XXXA Unspecified fall, initial encounter: Secondary | ICD-10-CM

## 2011-08-08 NOTE — Telephone Encounter (Signed)
appt 08-12-11 at 10 am. Pt's husband informed of appt. Lorenda Hatchet, Renato Battles

## 2011-08-08 NOTE — Progress Notes (Signed)
S: Pt states that she is doing well.  No complaints or concerns today.  Sometimes feeling sob- no wheezing. No problems with throat tightness.  Only occurs off and on.  No h/o breathing problems.  Did fall when walking down a hill- slide gently and landed on buttock with legs under her.  This occurred 3 days ago.  Baby has been moving well.  No vaginal bleeding or discharge.  No abd pain.   O: as per above.  Lungs- CTA bilateral.  Abd- soft, NT, ND.   A and P:  26 yo G1 at [redacted]w[redacted]d: - continue prenatal vitamin - discussed kick counts and signs of labor. -regarding fall- infant moving well, HR wnl, no abd pain, no vaginal bleeding- no signs of placental abruption/injury.  Discussed with Attending-Dr. Deirdre Priest.  Discussed red flags with patient.  No signs of injury now 3 days s/p fall.  Although no signs of problems- will obtain BPP to ensure infant well being.   Discussed with patient that it is important if any further falls to go to the MAU for evaluation immediately at time of fall.  -pt to return in 1 week for f/up.

## 2011-08-08 NOTE — Telephone Encounter (Signed)
Pt called back and request appt for bpp. We can leave appt info .Arlyss Repress

## 2011-08-08 NOTE — Assessment & Plan Note (Signed)
See above

## 2011-08-12 ENCOUNTER — Ambulatory Visit (INDEPENDENT_AMBULATORY_CARE_PROVIDER_SITE_OTHER): Payer: Self-pay | Admitting: *Deleted

## 2011-08-12 VITALS — BP 110/64 | Wt 141.3 lb

## 2011-08-12 DIAGNOSIS — W19XXXA Unspecified fall, initial encounter: Secondary | ICD-10-CM

## 2011-08-12 NOTE — Progress Notes (Signed)
P = 96   Report of pt Hx, current status and NST results discussed w/Dr. Shawnie Pons.  Plan of care changed to NST/AFI only today.  Pt has f/u prenatal visit tomorrow w/Dr. Elwyn Reach.  Pt instructed to return to hospital for decr. FM, leakage of fluid, heavy vaginal bleeding or regular UC's q5 min for >1hr.  Pt and husband voiced understanding.

## 2011-08-12 NOTE — Progress Notes (Signed)
NST reviewed and reactive.  

## 2011-08-13 ENCOUNTER — Ambulatory Visit: Payer: Self-pay | Admitting: Emergency Medicine

## 2011-08-13 VITALS — BP 106/72 | Wt 141.0 lb

## 2011-08-13 DIAGNOSIS — Z3400 Encounter for supervision of normal first pregnancy, unspecified trimester: Secondary | ICD-10-CM

## 2011-08-13 NOTE — Progress Notes (Signed)
S: 26 yo G1 at [redacted]w[redacted]d.  No concerns today.  + fetal movement.  No contractions, pressure, vaginal bleeding, LOF.    O: see flowsheet  A/P: 26 yo G1 at [redacted]w[redacted]d - continue prenatal vitamin - scheduled NST/BPP for next Mon and Thurs  - kick counts and labor precautions reviewed - f/u next week - will schedule induction if not delivered

## 2011-08-13 NOTE — Patient Instructions (Addendum)
If you feel like the baby is not moving as much as usual, please do "kick counts." To feel the baby, sit in a quiet place with your feet up, free of distractions. If you feel the baby kick 5 times in 1 hour, you can stop. If not, feel for a second hour. 10 kicks in 2 hours is very reassuring and makes [place] feel that the baby is doing well.  If you would like, you can dry drinking a small amount of caffeine before counting for the 2nd hour to help baby wake up.  If you do not feel 10 kicks in 2 hours while concentrating, please go to the MAU.   If you have regular contractions that are 3-5 minutes apart for at least one hour, bleeding or fluid from your vagina, or you do not feel the baby moving enough, please go to the [facility].  You have ultrasounds scheduled for next week on Monday and Thursday at 2pm.  These are to make sure the baby is doing well.    I will see you back next week, unless you have a baby first!  At that appointment we will scheduled an induction if needed.

## 2011-08-16 ENCOUNTER — Encounter (HOSPITAL_COMMUNITY): Payer: Self-pay | Admitting: Anesthesiology

## 2011-08-16 ENCOUNTER — Inpatient Hospital Stay (HOSPITAL_COMMUNITY)
Admission: AD | Admit: 2011-08-16 | Discharge: 2011-08-19 | DRG: 775 | Disposition: A | Discharge status: Home / Self Care | Payer: Medicaid Other | Source: Ambulatory Visit | Attending: Obstetrics & Gynecology | Admitting: Obstetrics & Gynecology

## 2011-08-16 ENCOUNTER — Encounter (HOSPITAL_COMMUNITY): Payer: Self-pay | Admitting: Obstetrics and Gynecology

## 2011-08-16 ENCOUNTER — Inpatient Hospital Stay (HOSPITAL_COMMUNITY): Payer: Medicaid Other | Admitting: Anesthesiology

## 2011-08-16 DIAGNOSIS — IMO0001 Reserved for inherently not codable concepts without codable children: Secondary | ICD-10-CM

## 2011-08-16 LAB — CBC
HCT: 37 % (ref 36.0–46.0)
MCV: 88.7 fL (ref 78.0–100.0)
RDW: 14.1 % (ref 11.5–15.5)
WBC: 14.3 10*3/uL — ABNORMAL HIGH (ref 4.0–10.5)

## 2011-08-16 MED ORDER — OXYCODONE-ACETAMINOPHEN 5-325 MG PO TABS: 1.0000 | ORAL_TABLET | ORAL | Status: DC | PRN

## 2011-08-16 MED ORDER — SODIUM BICARBONATE 8.4 % IV SOLN
INTRAVENOUS | Status: DC | PRN
  Administered 2011-08-16: 4 mL via EPIDURAL

## 2011-08-16 MED ORDER — LIDOCAINE HCL (PF) 1 % IJ SOLN
INTRAMUSCULAR | Status: DC | PRN
  Administered 2011-08-16: 4 mL
  Administered 2011-08-16: 3 mL

## 2011-08-16 MED ORDER — LACTATED RINGERS IV SOLN: INTRAVENOUS | Status: DC

## 2011-08-16 MED ORDER — DIPHENHYDRAMINE HCL 50 MG/ML IJ SOLN: 12.5000 mg | INTRAMUSCULAR | Status: DC | PRN

## 2011-08-16 MED ORDER — OXYTOCIN 20 UNITS IN LACTATED RINGERS INFUSION - SIMPLE
125.0000 mL/h | Freq: Once | INTRAVENOUS | Status: AC
  Administered 2011-08-17: 125 mL/h via INTRAVENOUS

## 2011-08-16 MED ORDER — EPHEDRINE 5 MG/ML INJ
10.0000 mg | INTRAVENOUS | Status: DC | PRN
  Filled 2011-08-16: qty 4

## 2011-08-16 MED ORDER — PHENYLEPHRINE 40 MCG/ML (10ML) SYRINGE FOR IV PUSH (FOR BLOOD PRESSURE SUPPORT)
80.0000 ug | PREFILLED_SYRINGE | INTRAVENOUS | Status: DC | PRN
  Administered 2011-08-16: 80 ug via INTRAVENOUS

## 2011-08-16 MED ORDER — LIDOCAINE HCL (PF) 1 % IJ SOLN
30.0000 mL | INTRAMUSCULAR | Status: DC | PRN
  Filled 2011-08-16: qty 30

## 2011-08-16 MED ORDER — OXYTOCIN BOLUS FROM INFUSION
500.0000 mL | Freq: Once | INTRAVENOUS | Status: DC
  Filled 2011-08-16: qty 500
  Filled 2011-08-16: qty 1000

## 2011-08-16 MED ORDER — EPHEDRINE 5 MG/ML INJ: 10.0000 mg | INTRAVENOUS | Status: DC | PRN

## 2011-08-16 MED ORDER — ACETAMINOPHEN 325 MG PO TABS: 650.0000 mg | ORAL_TABLET | ORAL | Status: DC | PRN

## 2011-08-16 MED ORDER — CITRIC ACID-SODIUM CITRATE 334-500 MG/5ML PO SOLN: 30.0000 mL | ORAL | Status: DC | PRN

## 2011-08-16 MED ORDER — LACTATED RINGERS IV SOLN
500.0000 mL | INTRAVENOUS | Status: DC | PRN
  Administered 2011-08-16: 500 mL via INTRAVENOUS

## 2011-08-16 MED ORDER — FLEET ENEMA 7-19 GM/118ML RE ENEM: 1.0000 | ENEMA | RECTAL | Status: DC | PRN

## 2011-08-16 MED ORDER — FENTANYL 2.5 MCG/ML BUPIVACAINE 1/10 % EPIDURAL INFUSION (WH - ANES)
INTRAMUSCULAR | Status: DC | PRN
  Administered 2011-08-16: 13 mL/h via EPIDURAL

## 2011-08-16 MED ORDER — LACTATED RINGERS IV SOLN
INTRAVENOUS | Status: DC
  Administered 2011-08-16: 125 mL/h via INTRAVENOUS
  Administered 2011-08-16 – 2011-08-17 (×3): via INTRAVENOUS

## 2011-08-16 MED ORDER — LACTATED RINGERS IV SOLN
500.0000 mL | Freq: Once | INTRAVENOUS | Status: AC
  Administered 2011-08-16: 1000 mL via INTRAVENOUS

## 2011-08-16 MED ORDER — PHENYLEPHRINE 40 MCG/ML (10ML) SYRINGE FOR IV PUSH (FOR BLOOD PRESSURE SUPPORT)
80.0000 ug | PREFILLED_SYRINGE | INTRAVENOUS | Status: DC | PRN
  Filled 2011-08-16: qty 5

## 2011-08-16 MED ORDER — BUTORPHANOL TARTRATE 2 MG/ML IJ SOLN
1.0000 mg | Freq: Once | INTRAMUSCULAR | Status: AC
  Administered 2011-08-16: 1 mg via INTRAVENOUS
  Filled 2011-08-16: qty 1

## 2011-08-16 MED ORDER — FENTANYL 2.5 MCG/ML BUPIVACAINE 1/10 % EPIDURAL INFUSION (WH - ANES)
14.0000 mL/h | INTRAMUSCULAR | Status: DC
  Administered 2011-08-16 – 2011-08-17 (×3): 14 mL/h via EPIDURAL
  Filled 2011-08-16 (×4): qty 60

## 2011-08-16 MED ORDER — BUTORPHANOL TARTRATE 2 MG/ML IJ SOLN: 1.0000 mg | INTRAMUSCULAR | Status: DC | PRN

## 2011-08-16 MED ORDER — ONDANSETRON HCL 4 MG/2ML IJ SOLN: 4.0000 mg | Freq: Four times a day (QID) | INTRAMUSCULAR | Status: DC | PRN

## 2011-08-16 NOTE — H&P (Signed)
[name] [name] is a 26 y.o. female presenting for contractions.  Patient at [facility] with Dr. Elwyn Reach. Maternal Medical History:  Reason for admission: Reason for admission: contractions.  Contractions: Onset was 6-12 hours ago.   Frequency: regular.   Perceived severity is moderate.    Fetal activity: Perceived fetal activity is normal.   Last perceived fetal movement was within the past hour.    Prenatal complications: no prenatal complications   OB History    Grav Para Term Preterm Abortions TAB SAB Ect Mult Living   1 0 0 0 0 0 0 0 0 0      Past Medical History  Diagnosis Date  . No pertinent past medical history    Past Surgical History  Procedure Date  . No past surgeries    Family History: family history includes Asthma in her mother and Miscarriages / Stillbirths in her maternal aunt. Social History:  reports that she has never smoked. She has never used smokeless tobacco. She reports that she does not drink alcohol or use illicit drugs.   Prenatal Transfer Tool  Maternal Diabetes: No Genetic Screening: Declined Maternal Ultrasounds/Referrals: Normal Fetal Ultrasounds or other Referrals:  None Maternal Substance Abuse:  No Significant Maternal Medications:  None Significant Maternal Lab Results:  Lab values include: Group B Strep negative Other Comments:  None  ROS  Dilation: 4 Effacement (%): 100 Station: -2 Exam by:: J. Rasch RN  Blood pressure 117/75, pulse 79, temperature 97.3 F (36.3 C), temperature source Oral, resp. rate 18, height 5' 0.5" (1.537 m), weight 64.411 kg (142 lb), last menstrual period 11/11/2010. Maternal Exam:  Introitus: Vagina is positive for vaginal discharge (mucusy).    Physical Exam  Constitutional: She is oriented to person, place, and time. She appears well-developed and well-nourished. No distress.  HENT:  Head: Normocephalic.  Neck: Normal range of motion. Neck supple.  Cardiovascular: Normal rate,  regular rhythm and normal heart sounds.   Respiratory: Effort normal and breath sounds normal. No respiratory distress.  GI: Soft. There is no tenderness.       EFW 6.5-7 LBS  Genitourinary: No bleeding around the vagina. Vaginal discharge (mucusy) found.       CERVIX 4/100/-2  Neurological: She is alert and oriented to person, place, and time.  Skin: Skin is warm and dry.    Prenatal labs: ABO, Rh: A/POS/-- (11/20 1332) Antibody: NEG (11/20 1332) Rubella: 0.8 (11/20 1332) RPR: NON REAC (04/04 1507)  HBsAg: NEGATIVE (11/20 1332)  HIV: NON REACTIVE (04/04 1507)  GBS: NEGATIVE (05/17 1226)   Assessment/Plan: Active Labor GBS negative  Admit to [facility]  Dr. Elwyn Reach notified and plans to come to hospital in one hour Anticipate NSVD   [facility] 08/16/2011, 1:13 PM

## 2011-08-16 NOTE — Anesthesia Preprocedure Evaluation (Signed)

## 2011-08-16 NOTE — Progress Notes (Signed)
W.Muhammad notified of patient's cervical exam. Plan to admit patient to L/D. Orders received for pain medication administration.

## 2011-08-16 NOTE — Anesthesia Procedure Notes (Signed)
Epidural Patient location during procedure: OB Start time: 08/16/2011 3:36 PM  Staffing Anesthesiologist: Penny Arrambide A. Performed by: anesthesiologist   Preanesthetic Checklist Completed: patient identified, site marked, surgical consent, pre-op evaluation, timeout performed, IV checked, risks and benefits discussed and monitors and equipment checked  Epidural Patient position: sitting Prep: site prepped and draped and DuraPrep Patient monitoring: continuous pulse ox and blood pressure Approach: midline Injection technique: LOR air  Needle:  Needle type: Tuohy  Needle gauge: 17 G Needle length: 9 cm Needle insertion depth: 4 cm Catheter type: closed end flexible Catheter size: 19 Gauge Catheter at skin depth: 9 cm Test dose: negative and Other  Assessment Events: blood not aspirated, injection not painful, no injection resistance, negative IV test and no paresthesia  Additional Notes Patient identified. Risks and benefits discussed including failed block, incomplete  Pain control, post dural puncture headache, nerve damage, paralysis, blood pressure Changes, nausea, vomiting, reactions to medications-both toxic and allergic and post Partum back pain. All questions were answered. Patient expressed understanding and wished to proceed. Sterile technique was used throughout procedure. Epidural site was Dressed with sterile barrier dressing. No paresthesias, signs of intravascular injection Or signs of intrathecal spread were encountered.  Patient was more comfortable after the epidural was dosed. Please see RN's note for documentation of vital signs and FHR which are stable.

## 2011-08-16 NOTE — Progress Notes (Signed)
[name] [name] is a 26 y.o. G1P0000 at [redacted]w[redacted]d admitted for active labor  Subjective: Epidural placed.  Comfortable.  Can still feel her contractions a little.  Objective: BP 115/79  Pulse 94  Temp 98.1 F (36.7 C) (Oral)  Resp 18  Ht 5' 0.5" (1.537 m)  Wt 142 lb (64.411 kg)  BMI 27.28 kg/m2  LMP 11/11/2010      FHT:  FHR: 135 bpm, variability: moderate,  accelerations:  Present,  decelerations:  Present few mild variables UC:   regular, every 2-4 minutes SVE:   Dilation: 5 Effacement (%): 100 Station: +1 Exam by:: Dr booth  Labs: Lab Results  Component Value Date   WBC 14.3* 08/16/2011   HGB 12.7 08/16/2011   HCT 37.0 08/16/2011   MCV 88.7 08/16/2011   PLT 284 08/16/2011    Assessment / Plan: Spontaneous labor, progressing normally  Labor: Progressing normally and consider AROM at next check Fetal Wellbeing:  Category I Pain Control:  Epidural I/D:  n/a Anticipated MOD:  NSVD  BOOTH, Mohab Ashby 08/16/2011, 5:18 PM

## 2011-08-16 NOTE — H&P (Signed)
Attestation of Attending Supervision of Advanced Practitioner: Evaluation and management procedures were performed by the OB Fellow/PA/CNM/NP under my supervision and collaboration. Chart reviewed, and agree with management and plan.  Kiyanna Biegler, M.D. 08/16/2011 2:49 PM   

## 2011-08-16 NOTE — Progress Notes (Signed)
[name] [name] is a 26 y.o. G1P0000 at [redacted]w[redacted]d admitted for active labor  Subjective: Doing well with contractions.  Planning on epidural  Objective: BP 116/65  Pulse 102  Temp 98 F (36.7 C) (Oral)  Resp 18  Ht 5' 0.5" (1.537 m)  Wt 142 lb (64.411 kg)  BMI 27.28 kg/m2  LMP 11/11/2010      FHT:  FHR: 125 bpm, variability: moderate,  accelerations:  Present,  decelerations:  Absent UC:   regular, every 3-5 minutes SVE:   Dilation: 4 Effacement (%): 100 Station: -2 Exam by:: J. Rasch RN   Labs: Lab Results  Component Value Date   WBC 14.3* 08/16/2011   HGB 12.7 08/16/2011   HCT 37.0 08/16/2011   MCV 88.7 08/16/2011   PLT 284 08/16/2011    Assessment / Plan: Spontaneous labor, progressing normally  Labor: Progressing normally Fetal Wellbeing:  Category I Pain Control:  Labor support without medications and planning on epidural I/D:  n/a Anticipated MOD:  NSVD  BOOTH, Nicholis Stepanek 08/16/2011, 2:48 PM

## 2011-08-16 NOTE — Progress Notes (Signed)
[name] [name] is a 26 y.o. G1P0000 at [redacted]w[redacted]d admitted for active labor  Subjective: Doing well.  Comfortable.   Objective: BP 114/77  Pulse 106  Temp 98.5 F (36.9 C) (Oral)  Resp 18  Ht 5' 0.5" (1.537 m)  Wt 142 lb (64.411 kg)  BMI 27.28 kg/m2  LMP 11/11/2010      FHT:  FHR: 130 bpm, variability: moderate,  accelerations:  Present,  decelerations:  Absent UC:   regular, every 2-4 minutes SVE:   Dilation: 5.5 Effacement (%): 100 Station: +1 Exam by:: Tressia Danas RN  Labs: Lab Results  Component Value Date   WBC 14.3* 08/16/2011   HGB 12.7 08/16/2011   HCT 37.0 08/16/2011   MCV 88.7 08/16/2011   PLT 284 08/16/2011    Assessment / Plan: Spontaneous labor, progressing normally  Labor: Progressing normally and AROM at 1905 Fetal Wellbeing:  Category I Pain Control:  Epidural I/D:  n/a Anticipated MOD:  NSVD  [name], [name] [name] 08/16/2011, 7:08 PM

## 2011-08-16 NOTE — Progress Notes (Signed)
Roney Marion CNM notified of cervical exam and patients contraction pattern. Plan to keep patient for an 1 hour and recheck cervix.

## 2011-08-16 NOTE — MAU Note (Signed)
Pt presents to MAU with chief complaint of labor. Pt says the contractions started last night at 2300 and has kept her awake most of the night. Pt is a G1 @ [redacted]w[redacted]d.

## 2011-08-16 NOTE — Progress Notes (Signed)
[name] [name] is a 26 y.o. G1P0000 at [redacted]w[redacted]d admitted for active labor  Subjective: Doing well.  Pain control better.  Complete.    Objective: BP 125/78  Pulse 162  Temp 97.6 F (36.4 C) (Oral)  Resp 18  Ht 5' 0.5" (1.537 m)  Wt 142 lb (64.411 kg)  BMI 27.28 kg/m2  LMP 11/11/2010   Total I/O In: -  Out: 400 [Urine:400]  FHT:  FHR: 130 bpm, variability: moderate,  accelerations:  Present,  decelerations:  Present mild variables, earlies UC:   regular, every 2-3 minutes SVE:   Dilation: 10 Effacement (%): 100 Station: +1 Exam by:: Dr. Elwyn Reach  Labs: Lab Results  Component Value Date   WBC 14.3* 08/16/2011   HGB 12.7 08/16/2011   HCT 37.0 08/16/2011   MCV 88.7 08/16/2011   PLT 284 08/16/2011    Assessment / Plan: Spontaneous labor, progressing normally  Labor: Progressing normally and will let labor down for an hour Fetal Wellbeing:  Category II Pain Control:  Epidural I/D:  n/a Anticipated MOD:  NSVD  BOOTH, Rosela Supak 08/16/2011, 11:57 PM

## 2011-08-16 NOTE — Progress Notes (Signed)
[name] [name] is a 26 y.o. G1P0000 at [redacted]w[redacted]d admitted for active labor  Subjective: Doing okay.  Feeling some pressure.  RN checked and is 7cm.  Amniotic fluid now stained with light meconium.  Feeling contractions more, RN gave additional epidural dose.  Objective: BP 125/77  Pulse 97  Temp 97.8 F (36.6 C) (Oral)  Resp 18  Ht 5' 0.5" (1.537 m)  Wt 142 lb (64.411 kg)  BMI 27.28 kg/m2  LMP 11/11/2010      FHT:  FHR: 130 bpm, variability: moderate,  accelerations:  Present,  decelerations:  Present early UC:   regular, every 2-4 minutes SVE:   Dilation: 6 Effacement (%): 100 Station: +2 Exam by:: Dr Elwyn Reach  Labs: Lab Results  Component Value Date   WBC 14.3* 08/16/2011   HGB 12.7 08/16/2011   HCT 37.0 08/16/2011   MCV 88.7 08/16/2011   PLT 284 08/16/2011    Assessment / Plan: Spontaneous labor, progressing normally  Labor: Progressing normally Fetal Wellbeing:  Category II Pain Control:  Epidural I/D:  n/a Anticipated MOD:  NSVD  BOOTH, Oakes Mccready 08/16/2011, 8:51 PM

## 2011-08-17 ENCOUNTER — Encounter (HOSPITAL_COMMUNITY): Payer: Self-pay | Admitting: *Deleted

## 2011-08-17 ENCOUNTER — Inpatient Hospital Stay (HOSPITAL_COMMUNITY): Payer: Medicaid Other

## 2011-08-17 MED ORDER — SIMETHICONE 80 MG PO CHEW: 80.0000 mg | CHEWABLE_TABLET | ORAL | Status: DC | PRN

## 2011-08-17 MED ORDER — SENNOSIDES-DOCUSATE SODIUM 8.6-50 MG PO TABS
2.0000 | ORAL_TABLET | Freq: Every day | ORAL | Status: DC
  Administered 2011-08-17 – 2011-08-19 (×2): 2 via ORAL

## 2011-08-17 MED ORDER — DIBUCAINE 1 % RE OINT: 1.0000 "application " | TOPICAL_OINTMENT | RECTAL | Status: DC | PRN

## 2011-08-17 MED ORDER — MEASLES, MUMPS & RUBELLA VAC ~~LOC~~ INJ
0.5000 mL | INJECTION | Freq: Once | SUBCUTANEOUS | Status: DC
  Filled 2011-08-17: qty 0.5

## 2011-08-17 MED ORDER — ONDANSETRON HCL 4 MG PO TABS: 4.0000 mg | ORAL_TABLET | ORAL | Status: DC | PRN

## 2011-08-17 MED ORDER — BENZOCAINE-MENTHOL 20-0.5 % EX AERO
1.0000 "application " | INHALATION_SPRAY | CUTANEOUS | Status: DC | PRN
  Administered 2011-08-17: 1 via TOPICAL
  Filled 2011-08-17: qty 56

## 2011-08-17 MED ORDER — LANOLIN HYDROUS EX OINT: TOPICAL_OINTMENT | CUTANEOUS | Status: DC | PRN

## 2011-08-17 MED ORDER — WITCH HAZEL-GLYCERIN EX PADS: 1.0000 "application " | MEDICATED_PAD | CUTANEOUS | Status: DC | PRN

## 2011-08-17 MED ORDER — DIPHENHYDRAMINE HCL 25 MG PO CAPS: 25.0000 mg | ORAL_CAPSULE | Freq: Four times a day (QID) | ORAL | Status: DC | PRN

## 2011-08-17 MED ORDER — OXYCODONE-ACETAMINOPHEN 5-325 MG PO TABS: 1.0000 | ORAL_TABLET | ORAL | Status: DC | PRN

## 2011-08-17 MED ORDER — ONDANSETRON HCL 4 MG/2ML IJ SOLN: 4.0000 mg | INTRAMUSCULAR | Status: DC | PRN

## 2011-08-17 MED ORDER — ZOLPIDEM TARTRATE 5 MG PO TABS: 5.0000 mg | ORAL_TABLET | Freq: Every evening | ORAL | Status: DC | PRN

## 2011-08-17 MED ORDER — IBUPROFEN 600 MG PO TABS
600.0000 mg | ORAL_TABLET | Freq: Four times a day (QID) | ORAL | Status: DC
  Administered 2011-08-17 – 2011-08-19 (×8): 600 mg via ORAL
  Filled 2011-08-17 (×8): qty 1

## 2011-08-17 MED ORDER — PRENATAL MULTIVITAMIN CH
1.0000 | ORAL_TABLET | Freq: Every day | ORAL | Status: DC
  Administered 2011-08-17 – 2011-08-19 (×3): 1 via ORAL
  Filled 2011-08-17 (×3): qty 1

## 2011-08-17 NOTE — Anesthesia Postprocedure Evaluation (Signed)
  Anesthesia Post-op Note  Patient: [name] [name]  Procedure(s) Performed: * No procedures listed *  Patient Location: Mother/Baby  Anesthesia Type: Epidural  Level of Consciousness: awake  Airway and Oxygen Therapy: Patient Spontanous Breathing  Post-op Pain: mild  Post-op Assessment: Patient's Cardiovascular Status Stable and Respiratory Function Stable  Post-op Vital Signs: stable  Complications: No apparent anesthesia complications

## 2011-08-18 ENCOUNTER — Other Ambulatory Visit: Payer: Self-pay

## 2011-08-18 LAB — CBC
MCH: 29.9 pg (ref 26.0–34.0)
Platelets: 216 10*3/uL (ref 150–400)
RBC: 3.31 MIL/uL — ABNORMAL LOW (ref 3.87–5.11)
WBC: 20.4 10*3/uL — ABNORMAL HIGH (ref 4.0–10.5)

## 2011-08-18 MED ORDER — MEASLES, MUMPS & RUBELLA VAC ~~LOC~~ INJ
0.5000 mL | INJECTION | SUBCUTANEOUS | Status: AC | PRN
  Administered 2011-08-19: 0.5 mL via SUBCUTANEOUS
  Filled 2011-08-18: qty 0.5

## 2011-08-18 NOTE — Progress Notes (Signed)
UR chart review completed.  

## 2011-08-18 NOTE — Progress Notes (Signed)
Post Partum Day 1 Subjective: up ad lib, voiding, tolerating PO and does feel a little weak (more in hands than feet), moderate lochia, absent BM, absent flatus, plans to breastfeed, undecided  Objective: Blood pressure 107/68, pulse 98, temperature 97.8 F (36.6 C), temperature source Oral, resp. rate 18, height 5' 0.5" (1.537 m), weight 142 lb (64.411 kg), last menstrual period 11/11/2010.  Physical Exam:  General: alert, cooperative, appears stated age and no distress Lochia: appropriate Uterine Fundus: firm DVT Evaluation: No evidence of DVT seen on physical exam. Negative Homan's sign. No cords or calf tenderness. No significant calf/ankle edema. Extremities: 2+ DP pulses, trace edema   Basename 08/18/11 0520 08/16/11 1200  HGB 9.9* 12.7  HCT 29.6* 37.0    Assessment/Plan: Plan for discharge tomorrow, Breastfeeding, Lactation consult Will give iron for anemia Information given on community doctors that do circumcisions   LOS: 2 days   BOOTH, Taraoluwa Thakur 08/18/2011, 7:24 AM

## 2011-08-18 NOTE — Progress Notes (Signed)
I have seen and examined this patient and I agree with the above. [name] [name], [name] [name] 8:06 AM 08/18/2011

## 2011-08-19 ENCOUNTER — Encounter: Payer: Self-pay | Admitting: Emergency Medicine

## 2011-08-19 MED ORDER — IBUPROFEN 600 MG PO TABS: 600.0000 mg | ORAL_TABLET | Freq: Four times a day (QID) | ORAL | Status: AC

## 2011-08-19 MED ORDER — FERROUS SULFATE 325 (65 FE) MG PO TABS
325.0000 mg | ORAL_TABLET | Freq: Three times a day (TID) | ORAL | Status: DC
  Administered 2011-08-19: 325 mg via ORAL
  Filled 2011-08-19: qty 1

## 2011-08-19 MED ORDER — SENNOSIDES-DOCUSATE SODIUM 8.6-50 MG PO TABS: 2.0000 | ORAL_TABLET | Freq: Every day | ORAL | Status: AC

## 2011-08-19 MED ORDER — FERROUS SULFATE 325 (65 FE) MG PO TABS: 325.0000 mg | ORAL_TABLET | Freq: Every day | ORAL | Status: AC

## 2011-08-19 NOTE — Progress Notes (Signed)
Post Partum Day 2 Subjective: no complaints, up ad lib, voiding and tolerating PO, moderate lochia, absent BM, absent flatus, plans to breastfeed, unsure of birth control  Objective: Blood pressure 93/60, pulse 82, temperature 97.4 F (36.3 C), temperature source Oral, resp. rate 18, height 5' 0.5" (1.537 m), weight 142 lb (64.411 kg), last menstrual period 11/11/2010.  Physical Exam:  General: alert, cooperative, appears stated age and no distress Lochia: appropriate Abdomen: +BS, soft, nontender,  Uterine Fundus: firm DVT Evaluation: No evidence of DVT seen on physical exam. Negative Homan's sign. No cords or calf tenderness. Extremities:    Basename 08/18/11 0520 08/16/11 1200  HGB 9.9* 12.7  HCT 29.6* 37.0    Assessment/Plan: Discharge home   LOS: 3 days   BOOTH, ERIN 08/19/2011, 8:03 AM   I have seen and examined this patient and I agree with the above. Haleema Vanderheyden 11:07 PM 08/19/2011

## 2011-08-19 NOTE — Discharge Summary (Signed)
Obstetric Discharge Summary Reason for Admission: onset of labor Prenatal Procedures: ultrasound Intrapartum Procedures: spontaneous vaginal delivery Postpartum Procedures: none Complications-Operative and Postpartum: none   Delivery Note At 4:49 AM a viable female was delivered via Vaginal, Spontaneous Delivery (Presentation: ; Occiput Anterior).  APGAR: 8, 9; weight 6 lb 5.8 oz (2885 g).   Placenta status: Intact, Spontaneous.  Cord: 3 vessels with the following complications: None.   Anesthesia: Epidural  Episiotomy: None Lacerations: None Est. Blood Loss (mL): 300  Mom to postpartum.  Baby to nursery-stable.  BOOTH, Kymberlyn Eckford 08/19/2011, 7:54 AM  H/H: Lab Results  Component Value Date/Time   HGB 9.9* 08/18/2011  5:20 AM   HCT 29.6* 08/18/2011  5:20 AM      Discharge Diagnoses: Term Pregnancy-delivered  Discharge Information: Date: 09/12/2010 Activity: pelvic rest Diet: routine Medications: PNV, Ibuprofen, Colace and Iron Breast feeding:  Yes Condition: stable Instructions: refer to practice specific booklet Discharge to: home   BOOTH, Brena Windsor 08/19/2011,7:54 AM

## 2011-08-19 NOTE — Discharge Instructions (Signed)

## 2011-08-21 ENCOUNTER — Other Ambulatory Visit: Payer: Self-pay

## 2011-10-07 ENCOUNTER — Telehealth: Payer: Self-pay | Admitting: Emergency Medicine

## 2011-10-07 NOTE — Telephone Encounter (Signed)
Called patient's husband, he says Latoiya definitely wants the mirena. I will have Dr Elwyn Reach sigh the scholarship form then fax it in. I told the patient if she qualifies someone from here would call her back and schedule an appointment for the insertion. Patient was told that it may take up to 4 weeks to hear back from the scholarship program.Busick, Rodena Medin

## 2011-10-07 NOTE — Telephone Encounter (Signed)
Husband is calling to confirm that his wife qualifies for the Mirena.

## 2011-10-20 ENCOUNTER — Encounter: Payer: Self-pay | Admitting: Emergency Medicine

## 2011-10-20 ENCOUNTER — Ambulatory Visit (INDEPENDENT_AMBULATORY_CARE_PROVIDER_SITE_OTHER): Payer: Self-pay | Admitting: Emergency Medicine

## 2011-10-20 VITALS — BP 108/72 | HR 80 | Ht 65.0 in | Wt 131.2 lb

## 2011-10-20 DIAGNOSIS — Z309 Encounter for contraceptive management, unspecified: Secondary | ICD-10-CM

## 2011-10-20 DIAGNOSIS — IMO0001 Reserved for inherently not codable concepts without codable children: Secondary | ICD-10-CM

## 2011-10-20 LAB — POCT URINE PREGNANCY: Preg Test, Ur: NEGATIVE

## 2011-10-20 MED ORDER — LEVONORGESTREL 20 MCG/24HR IU IUD
INTRAUTERINE_SYSTEM | Freq: Once | INTRAUTERINE | Status: AC
  Administered 2011-10-20: 1 via INTRAUTERINE

## 2011-10-20 NOTE — Addendum Note (Signed)
Addended by: Jone Baseman D on: 10/20/2011 01:47 PM   Modules accepted: Orders

## 2011-10-20 NOTE — Patient Instructions (Addendum)
It was nice to see you and your family again! Everything looks normal. I will see you back in 1 year for an annual exam or sooner if needed.  Intrauterine Device Insertion Care After Refer to this sheet in the next few weeks. These instructions provide you with information on caring for yourself after your procedure. Your caregiver may also give you more specific instructions. Your treatment has been planned according to current medical practices, but problems sometimes occur. Call your caregiver if you have any problems or questions after your procedure. HOME CARE INSTRUCTIONS   Only take over-the-counter or prescription medicines for pain, discomfort, or fever as directed by your caregiver. Do not use aspirin. This may increase bleeding.   Check your IUD to make sure it is in place before you resume sexual activity. You should be able to feel the strings. If you cannot feel the strings, something may be wrong. The IUD may have fallen out of the uterus, or the uterus may have been punctured (perforated) during placement. Also, if the strings are getting longer, it may mean that the IUD is being forced out of the uterus. You no longer have full protection from pregnancy if any of these problems occur.   You may resume sexual intercourse if you are not having problems with the IUD. The IUD is considered immediately effective.   You may resume normal activities.   Keep all follow-up appointments to be sure your IUD has remained in place. After the first exam, yearly exams are advised, unless you cannot feel the strings of your IUD.   Continue to check that the IUD is still in place by feeling for the strings after every menstrual period.  SEEK MEDICAL CARE IF:   You have bleeding that is heavier or lasts longer than a normal menstrual cycle.   You have a fever.   You have increasing cramps or abdominal pain not relieved with medicine.   You have abdominal pain that does not seem to be  related to the same area of earlier cramping and pain.   You are lightheaded, unusually weak, or faint.   You have abnormal vaginal discharge or smells.   You have pain during sexual intercourse.   You cannot feel the IUD strings, or the IUD string has gotten longer.   You feel the IUD at the opening of the cervix in the vagina.   You think you are pregnant, or you miss your menstrual period.   The IUD string is hurting your sex partner.  Document Released: 10/16/2010 Document Revised: 02/06/2011 Document Reviewed: 10/16/2010 [facility] Patient Information 2012 [place], [place].

## 2011-10-20 NOTE — Progress Notes (Signed)
  Subjective:    Patient ID: [name] [name], female    DOB: [birth year], 26 y.o.   MRN: [id number]  HPI Roman Nusz is here for postpartum check.  Doing well with no complaints.  Breast and formula feeding.  Planning on IUD (to be placed today) for birth control.  Reports getting adequate sleep.  Husband present for history and exam and is very supportive.  No report of sadness.  I have reviewed and updated the following as appropriate: allergies, current medications and problem list SHx: non-smoker  Review of Systems See HPI    Objective:   Physical Exam BP 108/72  Pulse 80  Ht 5\' 5"  (1.651 m)  Wt 131 lb 3.2 oz (59.512 kg)  BMI 21.83 kg/m2  Breastfeeding? Yes Gen: alert, cooperative, NAD HEENT: AT/[place], sclera white, MMM CV: RRR, no murmurs Pulm: CTAB, no wheezes or rales Abd: +BS, soft, NTND Ext: no edmea GU: normal external genitalia, normal vagina, normal PP cervix, bimanual exam unremarkable      Assessment & Plan:  IUD INSERTION: Patient given informed consent, signed copy in the chart..  Negative pregnancy confirmed.  Appropriate time out taken.   Sterile instruments and technique was used. Cervix brought into view with use of speculum and then cleansed three times with  betadine swabs.  A tenaculum was placed into the anterior lip of the cervix and a uterine sound was used to measure uterine size.   A Mirena IUD was placed into the endometrial cavity, deployed and secured. The applicator was removed. The strings were trimmed to 4 centimeters.   There were no complications and the patient tolerated the procedure well.   She was given handouts for post procedure instructions and information about the IUD including a card with the time of recommended removal.  Dr. Deirdre Priest was present for the entire procedure.

## 2011-10-20 NOTE — Assessment & Plan Note (Signed)
Doing well.  No pain or bleeding.  IUD placed today for birth control. Breast and formula feeding. MMR and varicella offered, deferred at this time due to cost.

## 2011-12-08 ENCOUNTER — Telehealth: Payer: Self-pay | Admitting: Emergency Medicine

## 2011-12-08 NOTE — Telephone Encounter (Signed)
Husband is calling to speak to the nurse about his wife about the Mirena.  She is having a problem and he wants to discuss this.

## 2011-12-08 NOTE — Telephone Encounter (Signed)
Husband states wife is uncomfortable during intercourse-feels pinching- and he feels the strings. Husband advised to sched OV to ck placement,etc of IUD w/Dr Elwyn Reach. He voiced understanding.

## 2011-12-17 ENCOUNTER — Encounter: Payer: Self-pay | Admitting: Emergency Medicine

## 2011-12-17 ENCOUNTER — Ambulatory Visit (INDEPENDENT_AMBULATORY_CARE_PROVIDER_SITE_OTHER): Payer: Self-pay | Admitting: Emergency Medicine

## 2011-12-17 VITALS — BP 99/68 | HR 80 | Ht 60.0 in | Wt 130.0 lb

## 2011-12-17 DIAGNOSIS — Z309 Encounter for contraceptive management, unspecified: Secondary | ICD-10-CM

## 2011-12-17 NOTE — Assessment & Plan Note (Signed)
Trimmed IUD strings.  Discussed that after 3 months, whatever bleeding pattern she has is likely to continue while the IUD is in place.  She will contact me in another month or so to let me know if we need to change birth control methods.  I would likely recommend OCP as this should have minimal impact on breast milk at that time.

## 2011-12-17 NOTE — Progress Notes (Signed)
  Subjective:    Patient ID: [name] [name], female    DOB: [birth year], 26 y.o.   MRN: [id number]  HPI [name] [name] is here for IUD follow up.  Reports that her husband can feel the strings during intercourse and it is uncomfortable.  She does not report any discomfort.  She has continued to have light spotting everyday, requiring a panty liner.  Denies any heavy bleeding.   I have reviewed and updated the following as appropriate: allergies and current medications SHx: never smoker    Review of Systems See HPI    Objective:   Physical Exam BP 99/68  Pulse 80  Ht 5' (1.524 m)  Wt 130 lb (58.968 kg)  BMI 25.39 kg/m2 Gen: alert, cooperative, NAD Pelvic: normal external genitalia, normal vagina, normal parous cervix, IUD strings visualized and cut to 2cm in length.  No blood seen in vagina or coming form cervix     Assessment & Plan:

## 2012-03-19 ENCOUNTER — Ambulatory Visit (INDEPENDENT_AMBULATORY_CARE_PROVIDER_SITE_OTHER): Payer: No Typology Code available for payment source | Admitting: *Deleted

## 2012-03-19 DIAGNOSIS — Z23 Encounter for immunization: Secondary | ICD-10-CM

## 2012-04-01 ENCOUNTER — Ambulatory Visit: Payer: No Typology Code available for payment source | Admitting: Emergency Medicine

## 2012-07-10 IMAGING — US US OB DETAIL+14 WK
1 of 2 series · 12 of 28 positions shown · non-contrast
Comparison: none

[Series 1: us ob detail +14 wk · 12 of 98 slices shown]
[im 1/98]
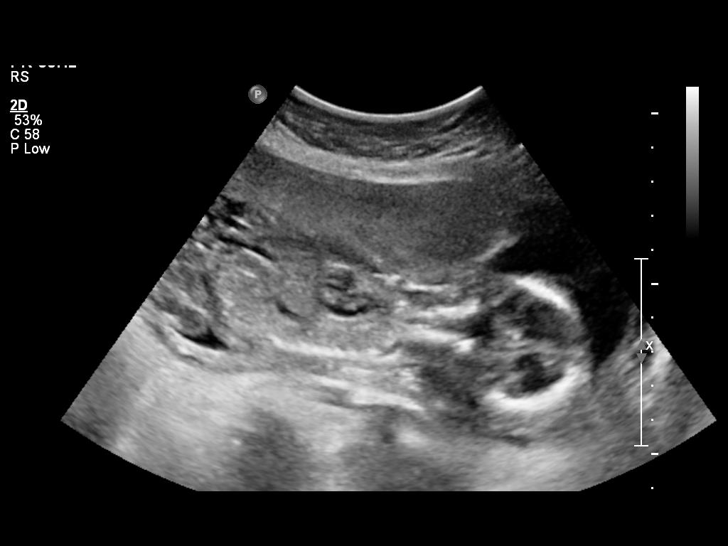
[im 8/98]
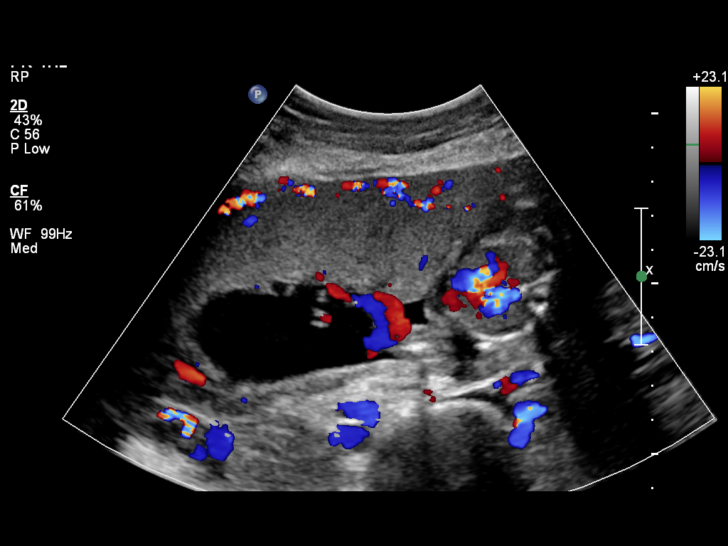
[im 15/98]
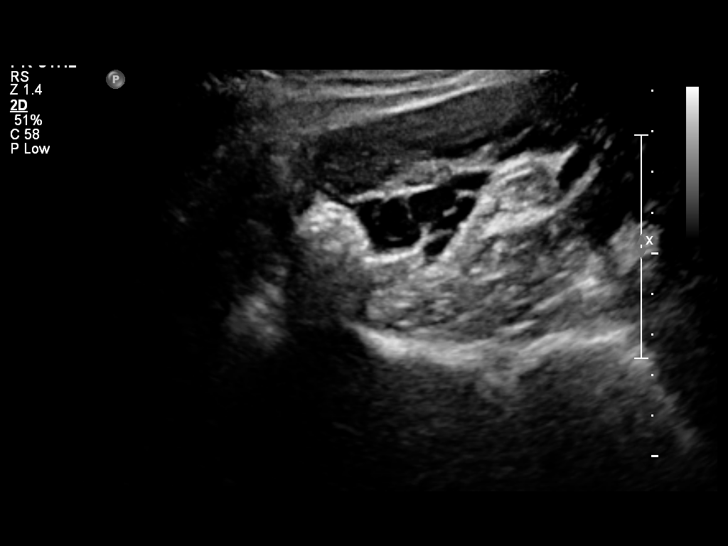
[im 27/98]
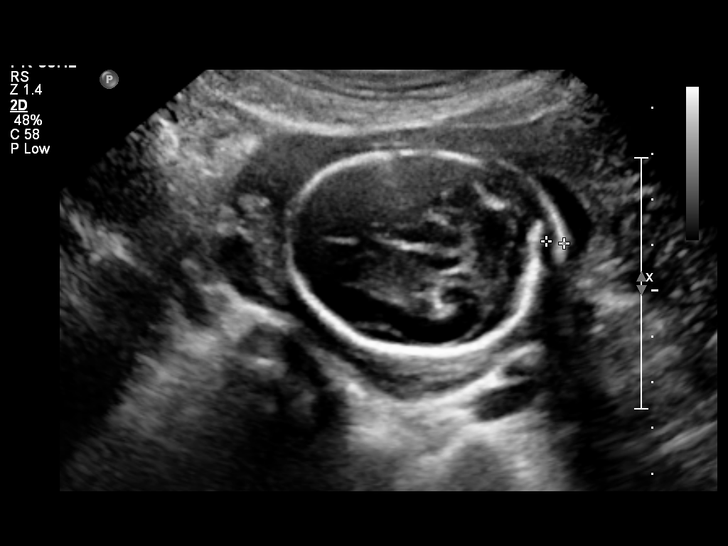
[im 34/98]
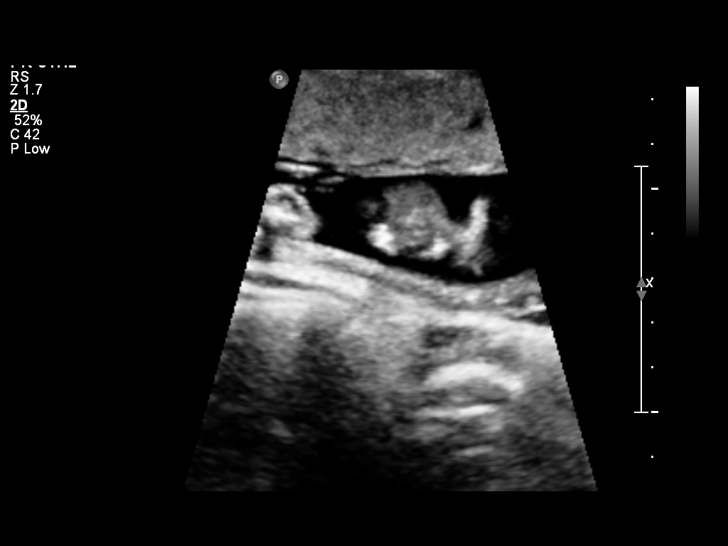
[im 42/98]
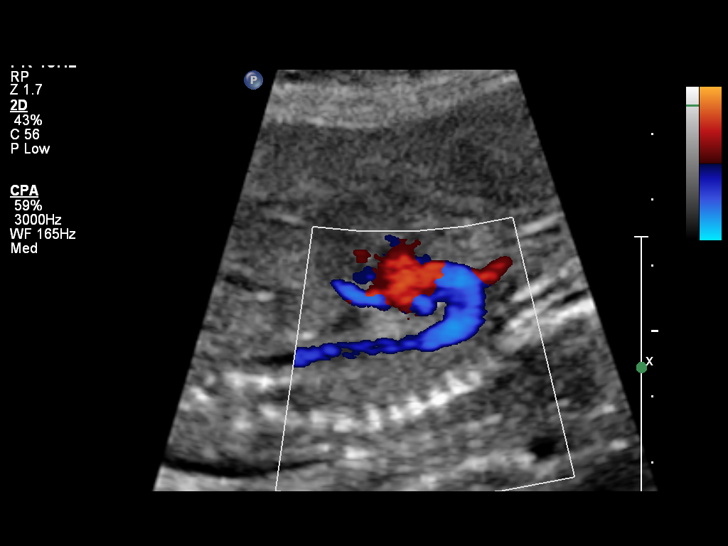
[im 53/98]
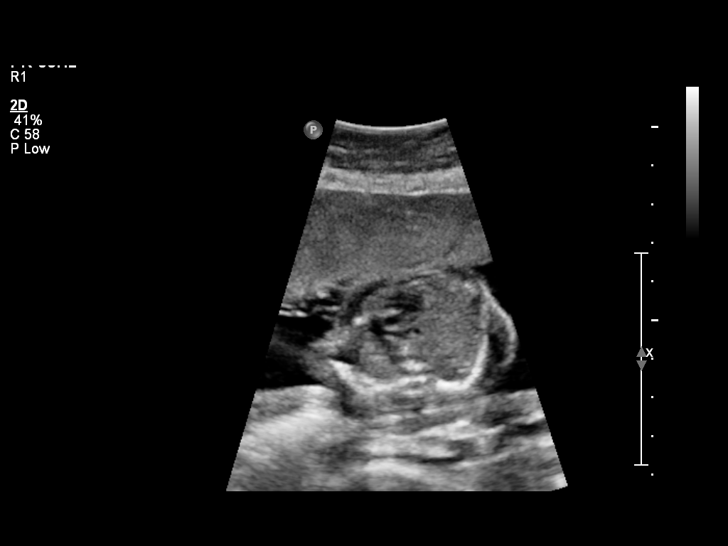
[im 60/98]
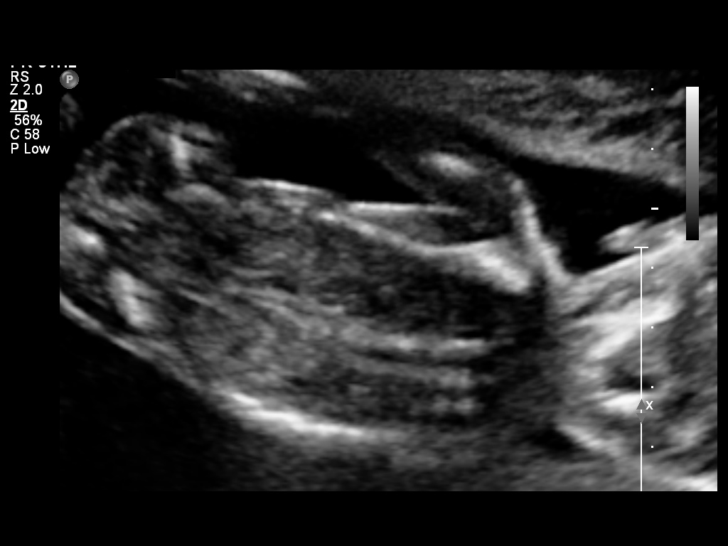
[im 68/98]
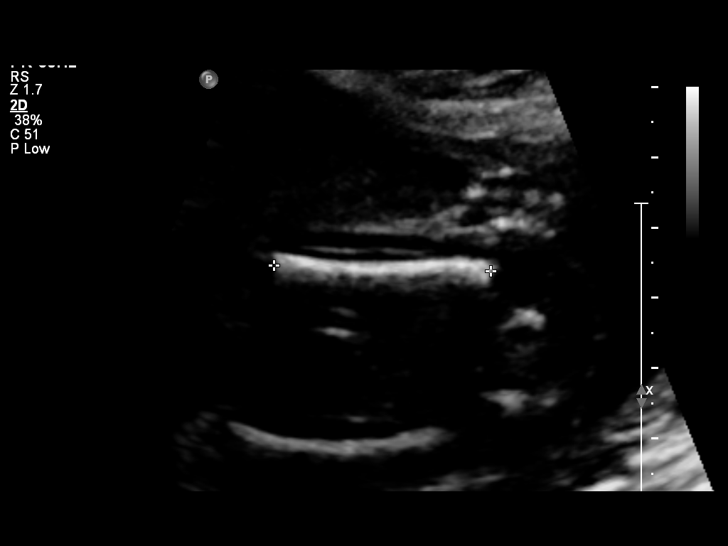
[im 79/98]
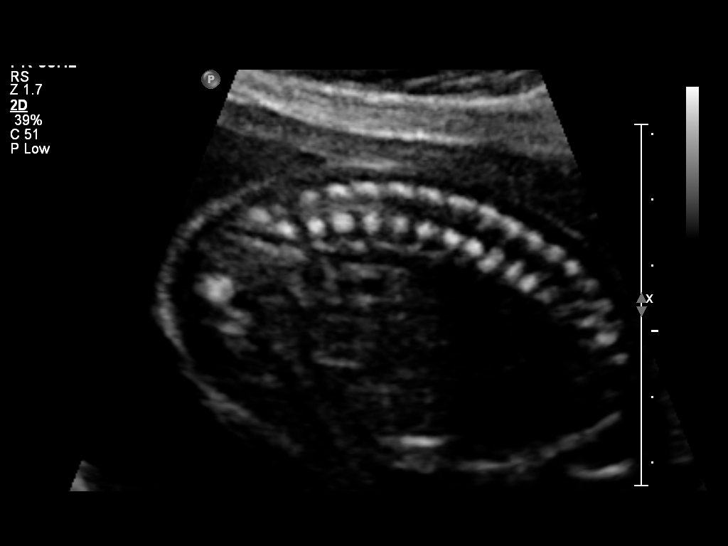
[im 86/98]
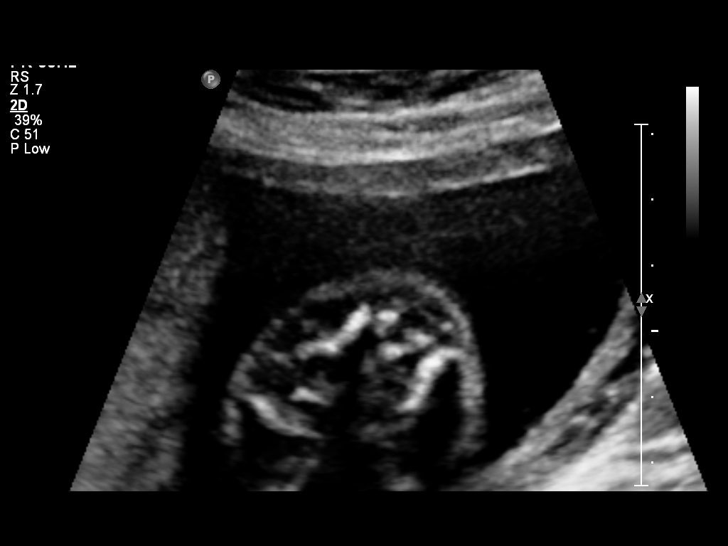
[im 94/98]
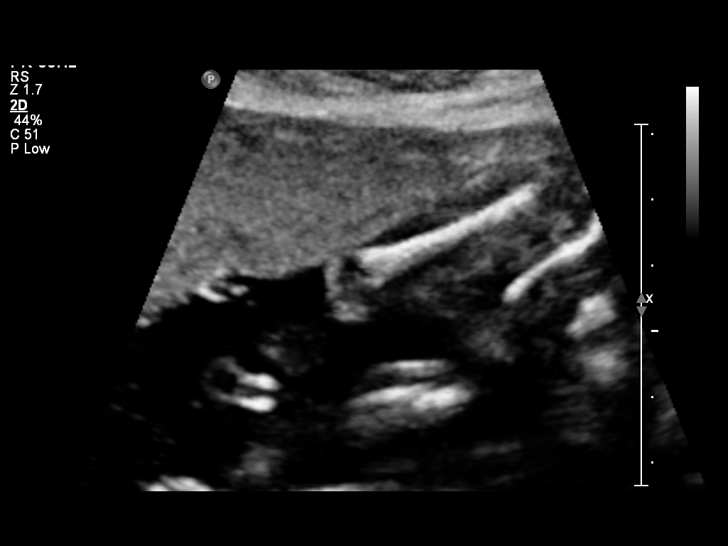

[12 of 28 positions shown; findings below may reference images not displayed]

OBSTETRICS REPORT
                      (Signed Final 03/19/2011 [DATE])

 Order#:         [id number]
Procedures

 US OB DETAIL + 14 WK                                  76811.0
Indications

 Detailed fetal anatomic survey
Fetal Evaluation

 Fetal Heart Rate:  147                         bpm
 Cardiac Activity:  Observed
 Presentation:      Cephalic
 Placenta:          Anterior, above cervical os
 P. Cord            Visualized
 Insertion:

 Amniotic Fluid
 AFI FV:      Subjectively within normal limits
                                             Larg Pckt:     4.4  cm
Biometry

 BPD:     44.1  mm    G. Age:   19w 2d                CI:        69.68   70 - 86
                                                      FL/HC:      18.3   15.8 -
                                                                         18
 HC:     168.6  mm    G. Age:   19w 4d       91  %    HC/AC:      1.26   1.07 -

 AC:     134.3  mm    G. Age:   19w 0d       67  %    FL/BPD:
 FL:      30.9  mm    G. Age:   19w 4d       85  %    FL/AC:      23.0   20 - 24
 HUM:       28  mm    G. Age:   19w 0d       74  %
 CER:       20  mm    G. Age:   19w 0d       74  %
 NFT:     4.69  mm

 Est. FW:     282  gm    0 lb 10 oz      62  %
Gestational Age

 LMP:           18w 2d       Date:   11/11/10                 EDD:   08/18/11
 U/S Today:     19w 3d                                        EDD:   08/10/11
 Best:          18w 2d    Det. By:   LMP  (11/11/10)          EDD:   08/18/11
Genetic Sonogram - Trisomy 21 Screening

 Age:                                             25          Risk=1:   885
 Echogenic bowel:                                 No          LR :
 Hypoplastic/absent Nasal bone:                   No
 Choroid plexus cysts:                            No
 Structural anomalies (inc. cardiac):             No          LR :
 Hypoplastic / absent midphalanx 5th Digit:       No
 Short femur:                                     No          LR :
 Short humerus:                                   No          LR :
 2-vessel umbilical cord:                         No
 Pyelectasis:                                     No          LR :
 Echogenic cardiac foci:                          No          LR :
 Nuchal fold thickening >= 6 mm:                  No          LR :

 11 Of 11 Criteria Were Visualized and 0 Abnormal(s) Were Seen.
 Ultrasound Modified Risk for Fetal Down Syndrome = [DATE]
Anatomy

 Cranium:           Appears normal      Aortic Arch:       Appears normal
 Fetal Cavum:       Appears normal      Ductal Arch:       Appears normal
 Ventricles:        Appears normal      Diaphragm:         Appears normal
 Choroid Plexus:    Appears normal      Stomach:           Appears
                                                           normal, left
                                                           sided
 Cerebellum:        Appears normal      Abdomen:           Appears normal
 Posterior Fossa:   Appears normal      Abdominal Wall:    Appears nml
                                                           (cord insert,
                                                           abd wall)
 Nuchal Fold:       Appears normal      Cord Vessels:      Appears normal
                    (neck, nuchal                          (3 vessel cord)
                    fold)
 Face:              Appears normal      Kidneys:           Appear normal
                    (lips/profile/orbit
                    s)
 Heart:             Appears normal      Bladder:           Appears normal
                    (4 chamber &
                    axis)
 RVOT:              Appears normal      Spine:             Appears normal
 LVOT:              Appears normal      Limbs:             Appears normal
                                                           (hands, ankles,
                                                           feet)

 Other:     Heels and 5th digit visualized. Nasal bone visualized.
Cervix Uterus Adnexa

 Cervical Length:   3.06      cm

 Cervix:       Normal appearance by transabdominal scan.

 Left Ovary:   Within normal limits.
 Right Ovary:  Within normal limits.
 Adnexa:     No abnormality visualized.
Impression

 Single living IUP.  US EGA is concordant with LMP.
 No fetal anomalies seen involving visualized anatomy.
 No sonographic markers for aneuploidy visualized.

 questions or concerns.

## 2013-11-08 ENCOUNTER — Telehealth: Payer: Self-pay | Admitting: Family Medicine

## 2013-11-08 NOTE — Telephone Encounter (Signed)
Shot record mailed to patient address.

## 2013-11-08 NOTE — Telephone Encounter (Signed)
Husband called and needs a copy of his wife's shot records mailed to them at their new address in [place].  [name] [name] [address] [place] [address]  Any question please call [phone] jw

## 2014-01-02 ENCOUNTER — Encounter: Payer: Self-pay | Admitting: Emergency Medicine
